# Patient Record
Sex: Female | Born: 1980
Health system: Southern US, Community
[De-identification: ages and names within clinical notes are randomized; demographics above are authoritative.]

## PROBLEM LIST (undated history)

## (undated) DIAGNOSIS — N200 Calculus of kidney: Secondary | ICD-10-CM

## (undated) HISTORY — PX: KIDNEY STONE SURGERY: SHX686

---

## 2013-02-03 ENCOUNTER — Encounter (HOSPITAL_BASED_OUTPATIENT_CLINIC_OR_DEPARTMENT_OTHER): Payer: Self-pay | Admitting: *Deleted

## 2013-02-03 ENCOUNTER — Emergency Department (HOSPITAL_BASED_OUTPATIENT_CLINIC_OR_DEPARTMENT_OTHER)
Admission: EM | Admit: 2013-02-03 | Discharge: 2013-02-04 | Disposition: A | Discharge status: Home / Self Care | Payer: Medicaid Other | Attending: Emergency Medicine | Admitting: Emergency Medicine

## 2013-02-03 DIAGNOSIS — Z331 Pregnant state, incidental: Secondary | ICD-10-CM | POA: Insufficient documentation

## 2013-02-03 DIAGNOSIS — R110 Nausea: Secondary | ICD-10-CM | POA: Insufficient documentation

## 2013-02-03 DIAGNOSIS — N760 Acute vaginitis: Secondary | ICD-10-CM | POA: Insufficient documentation

## 2013-02-03 DIAGNOSIS — Z3490 Encounter for supervision of normal pregnancy, unspecified, unspecified trimester: Secondary | ICD-10-CM

## 2013-02-03 DIAGNOSIS — R52 Pain, unspecified: Secondary | ICD-10-CM

## 2013-02-03 DIAGNOSIS — B9689 Other specified bacterial agents as the cause of diseases classified elsewhere: Secondary | ICD-10-CM

## 2013-02-03 DIAGNOSIS — O4690 Antepartum hemorrhage, unspecified, unspecified trimester: Secondary | ICD-10-CM

## 2013-02-03 DIAGNOSIS — F17200 Nicotine dependence, unspecified, uncomplicated: Secondary | ICD-10-CM | POA: Insufficient documentation

## 2013-02-03 DIAGNOSIS — N898 Other specified noninflammatory disorders of vagina: Secondary | ICD-10-CM | POA: Insufficient documentation

## 2013-02-03 LAB — WET PREP, GENITAL

## 2013-02-03 LAB — CBC WITH DIFFERENTIAL/PLATELET
Eosinophils Absolute: 0.1 10*3/uL (ref 0.0–0.7)
Eosinophils Relative: 1 % (ref 0–5)
Hemoglobin: 12.1 g/dL (ref 12.0–15.0)
Lymphs Abs: 2.8 10*3/uL (ref 0.7–4.0)
MCH: 34.1 pg — ABNORMAL HIGH (ref 26.0–34.0)
MCV: 96.9 fL (ref 78.0–100.0)
Monocytes Absolute: 1 10*3/uL (ref 0.1–1.0)
Monocytes Relative: 11 % (ref 3–12)
Platelets: 271 10*3/uL (ref 150–400)
RBC: 3.55 MIL/uL — ABNORMAL LOW (ref 3.87–5.11)

## 2013-02-03 LAB — URINALYSIS, ROUTINE W REFLEX MICROSCOPIC
Bilirubin Urine: NEGATIVE
Ketones, ur: NEGATIVE mg/dL
Nitrite: NEGATIVE
pH: 7 (ref 5.0–8.0)

## 2013-02-03 LAB — BASIC METABOLIC PANEL
BUN: 10 mg/dL (ref 6–23)
CO2: 25 mEq/L (ref 19–32)
Calcium: 9.3 mg/dL (ref 8.4–10.5)
Creatinine, Ser: 0.7 mg/dL (ref 0.50–1.10)
Glucose, Bld: 103 mg/dL — ABNORMAL HIGH (ref 70–99)

## 2013-02-03 LAB — URINE MICROSCOPIC-ADD ON

## 2013-02-03 MED ORDER — METRONIDAZOLE 500 MG PO TABS: 500.0000 mg | ORAL_TABLET | Freq: Two times a day (BID) | ORAL | Status: DC

## 2013-02-03 NOTE — ED Notes (Signed)
Abdominal pain x 3 days. Vaginal discharge that is beige and thick.

## 2013-02-03 NOTE — ED Provider Notes (Signed)
History     CSN: [id number]  Arrival date & time 02/03/13  2058   First MD Initiated Contact with Patient 02/03/13 2256      Chief Complaint  Patient presents with  . Abdominal Pain    (Consider location/radiation/quality/duration/timing/severity/associated sxs/prior treatment) HPI Comments: Patient presents with a three day history of lower abdominal cramping, nausea, "not feeling well".  She also complains of a slight vaginal discharge.  She is sexually active with one partner not using protection.  Last menstrual period was early last month.  She is a couple weeks late.  Has been pregnant once and has one child.  Patient is a 32 y.o. female presenting with abdominal pain. The history is provided by the patient.  Abdominal Pain This is a new problem. Episode onset: 3 days ago. The problem occurs constantly. The problem has been gradually worsening. Associated symptoms include abdominal pain. Nothing aggravates the symptoms. Nothing relieves the symptoms. She has tried nothing for the symptoms.    History reviewed. No pertinent past medical history.  History reviewed. No pertinent past surgical history.  No family history on file.  History  Substance Use Topics  . Smoking status: Current Every Day Smoker -- 0.50 packs/day    Types: Cigarettes  . Smokeless tobacco: Not on file  . Alcohol Use: Yes    OB History   Grav Para Term Preterm Abortions TAB SAB Ect Mult Living                  Review of Systems  Gastrointestinal: Positive for abdominal pain.  All other systems reviewed and are negative.    Allergies  Morphine and related  Home Medications  No current outpatient prescriptions on file.  BP 124/72  Pulse 76  Temp(Src) 98.8 F (37.1 C) (Oral)  Resp 20  Wt 115 lb (52.164 kg)  SpO2 97%  LMP 12/19/2012  Physical Exam  Nursing note and vitals reviewed. Constitutional: She is oriented to person, place, and time. She appears well-developed and  well-nourished. No distress.  HENT:  Head: Normocephalic and atraumatic.  Neck: Normal range of motion. Neck supple.  Cardiovascular: Normal rate and regular rhythm.  Exam reveals no gallop and no friction rub.   No murmur heard. Pulmonary/Chest: Effort normal and breath sounds normal. No respiratory distress. She has no wheezes.  Abdominal: Soft. Bowel sounds are normal. She exhibits no distension. There is tenderness.  There is mild suprapubic ttp with no rebound or guarding.    Genitourinary: Uterus normal. Vaginal discharge found.  There is a slight vaginal discharge present.  There are no adnexal masses and no cmt.  Musculoskeletal: Normal range of motion.  Neurological: She is alert and oriented to person, place, and time.  Skin: Skin is warm and dry. She is not diaphoretic.    ED Course  Procedures (including critical care time)  Labs Reviewed  URINALYSIS, ROUTINE W REFLEX MICROSCOPIC - Abnormal; Notable for the following:    APPearance CLOUDY (*)    Hgb urine dipstick TRACE (*)    Leukocytes, UA SMALL (*)    All other components within normal limits  PREGNANCY, URINE - Abnormal; Notable for the following:    Preg Test, Ur POSITIVE (*)    All other components within normal limits  URINE MICROSCOPIC-ADD ON - Abnormal; Notable for the following:    Squamous Epithelial / LPF MANY (*)    Bacteria, UA FEW (*)    All other components within normal limits  URINE CULTURE  WET PREP, GENITAL  GC/CHLAMYDIA PROBE AMP  CBC WITH DIFFERENTIAL  HCG, QUANTITATIVE, PREGNANCY  BASIC METABOLIC PANEL   No results found.   No diagnosis found.    MDM  The patient presents with lower abd discomfort, nausea, vaginal discharge, and "not feeling well."  The pregnancy test is positive with a quant beta of 30k.  There is no bleeding and she is hemodynamically stable.  The wet prep suggests bv, and I will treat with flagyl.  I doubt a ruptured ectopic, but will bring her back for an  ultrasound in the morning.          Geoffery Lyons, MD 02/03/13 2351

## 2013-02-04 ENCOUNTER — Ambulatory Visit (HOSPITAL_BASED_OUTPATIENT_CLINIC_OR_DEPARTMENT_OTHER)
Admission: RE | Admit: 2013-02-04 | Discharge: 2013-02-04 | Disposition: A | Discharge status: Home / Self Care | Payer: Medicaid Other | Source: Ambulatory Visit | Attending: Emergency Medicine | Admitting: Emergency Medicine

## 2013-02-04 ENCOUNTER — Encounter (HOSPITAL_BASED_OUTPATIENT_CLINIC_OR_DEPARTMENT_OTHER): Payer: Self-pay

## 2013-02-04 ENCOUNTER — Other Ambulatory Visit (HOSPITAL_BASED_OUTPATIENT_CLINIC_OR_DEPARTMENT_OTHER): Payer: Self-pay | Admitting: Emergency Medicine

## 2013-02-04 DIAGNOSIS — R52 Pain, unspecified: Secondary | ICD-10-CM

## 2013-02-04 DIAGNOSIS — O9989 Other specified diseases and conditions complicating pregnancy, childbirth and the puerperium: Secondary | ICD-10-CM | POA: Insufficient documentation

## 2013-02-04 DIAGNOSIS — O43899 Other placental disorders, unspecified trimester: Secondary | ICD-10-CM | POA: Insufficient documentation

## 2013-02-04 DIAGNOSIS — O210 Mild hyperemesis gravidarum: Secondary | ICD-10-CM | POA: Insufficient documentation

## 2013-02-04 DIAGNOSIS — R109 Unspecified abdominal pain: Secondary | ICD-10-CM | POA: Insufficient documentation

## 2013-02-04 DIAGNOSIS — O368990 Maternal care for other specified fetal problems, unspecified trimester, not applicable or unspecified: Secondary | ICD-10-CM | POA: Insufficient documentation

## 2013-02-04 DIAGNOSIS — O208 Other hemorrhage in early pregnancy: Secondary | ICD-10-CM | POA: Insufficient documentation

## 2013-02-05 LAB — URINE CULTURE: Colony Count: 40000

## 2013-02-06 ENCOUNTER — Telehealth (HOSPITAL_COMMUNITY): Payer: Self-pay | Admitting: Emergency Medicine

## 2013-02-06 NOTE — ED Notes (Signed)
Patient has +Gonorrhea. °

## 2013-02-06 NOTE — ED Notes (Signed)
+  Gonorrhea. Chart sent to EDP office for review. [facility]HS attached. °

## 2013-02-07 ENCOUNTER — Telehealth (HOSPITAL_COMMUNITY): Payer: Self-pay | Admitting: Emergency Medicine

## 2013-02-07 NOTE — Telephone Encounter (Signed)
Chart returned from EDP office. Trixie Dredge reviewed and ordered Cefixdine 400mg  po x 1 and Azithromycin 1 gram po x1. This was called to [facility] at [phone] per pts request. Message left on voice mail.

## 2014-07-17 ENCOUNTER — Encounter (HOSPITAL_BASED_OUTPATIENT_CLINIC_OR_DEPARTMENT_OTHER): Payer: Self-pay

## 2015-02-01 ENCOUNTER — Emergency Department (HOSPITAL_BASED_OUTPATIENT_CLINIC_OR_DEPARTMENT_OTHER)
Admission: EM | Admit: 2015-02-01 | Discharge: 2015-02-01 | Disposition: A | Discharge status: Home / Self Care | Payer: Medicaid Other | Attending: Emergency Medicine | Admitting: Emergency Medicine

## 2015-02-01 ENCOUNTER — Encounter (HOSPITAL_BASED_OUTPATIENT_CLINIC_OR_DEPARTMENT_OTHER): Payer: Self-pay | Admitting: *Deleted

## 2015-02-01 DIAGNOSIS — R197 Diarrhea, unspecified: Secondary | ICD-10-CM | POA: Insufficient documentation

## 2015-02-01 DIAGNOSIS — R1110 Vomiting, unspecified: Secondary | ICD-10-CM | POA: Insufficient documentation

## 2015-02-01 DIAGNOSIS — Z720 Tobacco use: Secondary | ICD-10-CM | POA: Insufficient documentation

## 2015-02-01 DIAGNOSIS — Z792 Long term (current) use of antibiotics: Secondary | ICD-10-CM | POA: Insufficient documentation

## 2015-02-01 DIAGNOSIS — J029 Acute pharyngitis, unspecified: Secondary | ICD-10-CM | POA: Insufficient documentation

## 2015-02-01 LAB — RAPID STREP SCREEN (MED CTR MEBANE ONLY): Streptococcus, Group A Screen (Direct): NEGATIVE

## 2015-02-01 MED ORDER — ONDANSETRON 4 MG PO TBDP: 4.0000 mg | ORAL_TABLET | Freq: Three times a day (TID) | ORAL | Status: DC | PRN

## 2015-02-01 NOTE — Discharge Instructions (Signed)
Nausea and Vomiting Nausea means you feel sick to your stomach. Throwing up (vomiting) is a reflex where stomach contents come out of your mouth. HOME CARE   Take medicine as told by your doctor.  Do not force yourself to eat. However, you do need to drink fluids.  If you feel like eating, eat a normal diet as told by your doctor.  Eat rice, wheat, potatoes, bread, lean meats, yogurt, fruits, and vegetables.  Avoid high-fat foods.  Drink enough fluids to keep your pee (urine) clear or pale yellow.  Ask your doctor how to replace body fluid losses (rehydrate). Signs of body fluid loss (dehydration) include:  Feeling very thirsty.  Dry lips and mouth.  Feeling dizzy.  Dark pee.  Peeing less than normal.  Feeling confused.  Fast breathing or heart rate. GET HELP RIGHT AWAY IF:   You have blood in your throw up.  You have black or bloody poop (stool).  You have a bad headache or stiff neck.  You feel confused.  You have bad belly (abdominal) pain.  You have chest pain or trouble breathing.  You do not pee at least once every 8 hours.  You have cold, clammy skin.  You keep throwing up after 24 to 48 hours.  You have a fever. MAKE SURE YOU:   Understand these instructions.  Will watch your condition.  Will get help right away if you are not doing well or get worse. Document Released: 02/18/2008 Document Revised: 11/24/2011 Document Reviewed: 01/31/2011 [facility] Patient Information 2015 [place], [place]. This information is not intended to replace advice given to you by your health care provider. Make sure you discuss any questions you have with your health care provider.  Pharyngitis Pharyngitis is a sore throat (pharynx). There is redness, pain, and swelling of your throat. HOME CARE   Drink enough fluids to keep your pee (urine) clear or pale yellow.  Only take medicine as told by your doctor.  You may get sick again if you do not take medicine as  told. Finish your medicines, even if you start to feel better.  Do not take aspirin.  Rest.  Rinse your mouth (gargle) with salt water ( tsp of salt per 1 qt of water) every 1-2 hours. This will help the pain.  If you are not at risk for choking, you can suck on hard candy or sore throat lozenges. GET HELP IF:  You have large, tender lumps on your neck.  You have a rash.  You cough up green, yellow-brown, or bloody spit. GET HELP RIGHT AWAY IF:   You have a stiff neck.  You drool or cannot swallow liquids.  You throw up (vomit) or are not able to keep medicine or liquids down.  You have very bad pain that does not go away with medicine.  You have problems breathing (not from a stuffy nose). MAKE SURE YOU:   Understand these instructions.  Will watch your condition.  Will get help right away if you are not doing well or get worse. Document Released: 02/18/2008 Document Revised: 06/22/2013 Document Reviewed: 05/09/2013 [facility]Care Patient Information 2015 [place], [place]. This information is not intended to replace advice given to you by your health care provider. Make sure you discuss any questions you have with your health care provider.

## 2015-02-01 NOTE — ED Provider Notes (Signed)
CSN: [id number]     Arrival date & time 02/01/15  1430 History   First MD Initiated Contact with Patient 02/01/15 1446     Chief Complaint  Patient presents with  . Sore Throat     (Consider location/radiation/quality/duration/timing/severity/associated sxs/prior Treatment) HPI Comments: Pt comes in with c/o sore throat, chill times a couple of days. Had vomiting and diarrhea for about 8 hours which has resolved. Pt daughter was diagnosed with strep earlier this week. No cough. No meningeal symptoms.no definite fever  The history is provided by the patient. No language interpreter was used.    History reviewed. No pertinent past medical history. History reviewed. No pertinent past surgical history. No family history on file. History  Substance Use Topics  . Smoking status: Current Every Day Smoker -- 0.50 packs/day    Types: Cigarettes  . Smokeless tobacco: Not on file  . Alcohol Use: Yes   OB History    Gravida Para Term Preterm AB TAB SAB Ectopic Multiple Living   1              Review of Systems  All other systems reviewed and are negative.     Allergies  Morphine and related  Home Medications   Prior to Admission medications   Medication Sig Start Date End Date Taking? Authorizing Provider  metroNIDAZOLE (FLAGYL) 500 MG tablet Take 1 tablet (500 mg total) by mouth 2 (two) times daily. One po bid x 7 days 02/03/13   Geoffery Lyonsouglas Delo, MD   BP 119/78 mmHg  Pulse 110  Temp(Src) 98.9 F (37.2 C) (Oral)  Resp 20  Ht 5\' 4"  (1.626 m)  Wt 112 lb 6 oz (50.973 kg)  BMI 19.28 kg/m2  SpO2 99%  LMP 01/02/2015  Breastfeeding? Unknown Physical Exam  Constitutional: She is oriented to person, place, and time. She appears well-developed and well-nourished.  HENT:  Right Ear: External ear normal.  Left Ear: External ear normal.  Nose: Rhinorrhea present.  Mouth/Throat: Posterior oropharyngeal edema and posterior oropharyngeal erythema present. No oropharyngeal exudate.   Eyes: Conjunctivae and EOM are normal. Pupils are equal, round, and reactive to light.  Neck: Normal range of motion. Neck supple.  Cardiovascular: Normal rate and regular rhythm.   Pulmonary/Chest: Effort normal and breath sounds normal.  Abdominal: Soft. Bowel sounds are normal.  Musculoskeletal: Normal range of motion.  Neurological: She is alert and oriented to person, place, and time.  Skin: Skin is warm and dry.  Psychiatric: She has a normal mood and affect.  Nursing note and vitals reviewed.   ED Course  Procedures (including critical care time) Labs Review Labs Reviewed  RAPID STREP SCREEN  CULTURE, GROUP A STREP    Imaging Review No results found.   EKG Interpretation None      MDM   Final diagnoses:  Pharyngitis  Vomiting and diarrhea    Likely viral. Pt is tolerating po without any problem.    Teressa LowerVrinda Jerrick Farve, NP 02/01/15 1548  Elwin MochaBlair Walden, MD 02/02/15 [phone]

## 2015-02-01 NOTE — ED Notes (Signed)
Sore throat, vomiting, diarrhea, chills and cough.  

## 2015-02-03 LAB — CULTURE, GROUP A STREP: STREP A CULTURE: NEGATIVE

## 2015-12-25 ENCOUNTER — Emergency Department (HOSPITAL_BASED_OUTPATIENT_CLINIC_OR_DEPARTMENT_OTHER): Payer: Medicaid Other

## 2015-12-25 ENCOUNTER — Emergency Department (HOSPITAL_BASED_OUTPATIENT_CLINIC_OR_DEPARTMENT_OTHER)
Admission: EM | Admit: 2015-12-25 | Discharge: 2015-12-25 | Disposition: A | Discharge status: Home / Self Care | Payer: Medicaid Other | Attending: Emergency Medicine | Admitting: Emergency Medicine

## 2015-12-25 ENCOUNTER — Encounter (HOSPITAL_BASED_OUTPATIENT_CLINIC_OR_DEPARTMENT_OTHER): Payer: Self-pay | Admitting: *Deleted

## 2015-12-25 DIAGNOSIS — F17210 Nicotine dependence, cigarettes, uncomplicated: Secondary | ICD-10-CM | POA: Insufficient documentation

## 2015-12-25 DIAGNOSIS — K5900 Constipation, unspecified: Secondary | ICD-10-CM

## 2015-12-25 DIAGNOSIS — B9689 Other specified bacterial agents as the cause of diseases classified elsewhere: Secondary | ICD-10-CM

## 2015-12-25 DIAGNOSIS — A599 Trichomoniasis, unspecified: Secondary | ICD-10-CM | POA: Insufficient documentation

## 2015-12-25 DIAGNOSIS — N760 Acute vaginitis: Secondary | ICD-10-CM | POA: Insufficient documentation

## 2015-12-25 DIAGNOSIS — R1030 Lower abdominal pain, unspecified: Secondary | ICD-10-CM

## 2015-12-25 LAB — CBC WITH DIFFERENTIAL/PLATELET
BASOS PCT: 0 %
Basophils Absolute: 0 10*3/uL (ref 0.0–0.1)
EOS ABS: 0.1 10*3/uL (ref 0.0–0.7)
EOS PCT: 1 %
HEMATOCRIT: 35.6 % — AB (ref 36.0–46.0)
HEMOGLOBIN: 11.8 g/dL — AB (ref 12.0–15.0)
Lymphocytes Relative: 25 %
Lymphs Abs: 1.9 10*3/uL (ref 0.7–4.0)
MCH: 32.4 pg (ref 26.0–34.0)
MCHC: 33.1 g/dL (ref 30.0–36.0)
MCV: 97.8 fL (ref 78.0–100.0)
Monocytes Absolute: 0.6 10*3/uL (ref 0.1–1.0)
Monocytes Relative: 8 %
NEUTROS PCT: 66 %
Neutro Abs: 5.1 10*3/uL (ref 1.7–7.7)
Platelets: 333 10*3/uL (ref 150–400)
RBC: 3.64 MIL/uL — ABNORMAL LOW (ref 3.87–5.11)
RDW: 12.2 % (ref 11.5–15.5)
WBC: 7.6 10*3/uL (ref 4.0–10.5)

## 2015-12-25 LAB — URINALYSIS, ROUTINE W REFLEX MICROSCOPIC
Bilirubin Urine: NEGATIVE
Glucose, UA: NEGATIVE mg/dL
Ketones, ur: NEGATIVE mg/dL
Nitrite: NEGATIVE
PROTEIN: 30 mg/dL — AB
SPECIFIC GRAVITY, URINE: 1.028 (ref 1.005–1.030)
pH: 6 (ref 5.0–8.0)

## 2015-12-25 LAB — COMPREHENSIVE METABOLIC PANEL
ALBUMIN: 4.1 g/dL (ref 3.5–5.0)
ALK PHOS: 47 U/L (ref 38–126)
ALT: 11 U/L — ABNORMAL LOW (ref 14–54)
ANION GAP: 3 — AB (ref 5–15)
AST: 16 U/L (ref 15–41)
BILIRUBIN TOTAL: 0.7 mg/dL (ref 0.3–1.2)
BUN: 7 mg/dL (ref 6–20)
CO2: 26 mmol/L (ref 22–32)
Calcium: 8.8 mg/dL — ABNORMAL LOW (ref 8.9–10.3)
Chloride: 107 mmol/L (ref 101–111)
Creatinine, Ser: 0.77 mg/dL (ref 0.44–1.00)
Glucose, Bld: 101 mg/dL — ABNORMAL HIGH (ref 65–99)
POTASSIUM: 3.8 mmol/L (ref 3.5–5.1)
Sodium: 136 mmol/L (ref 135–145)
TOTAL PROTEIN: 7 g/dL (ref 6.5–8.1)

## 2015-12-25 LAB — WET PREP, GENITAL
Sperm: NONE SEEN
YEAST WET PREP: NONE SEEN

## 2015-12-25 LAB — URINE MICROSCOPIC-ADD ON

## 2015-12-25 LAB — PREGNANCY, URINE: PREG TEST UR: NEGATIVE

## 2015-12-25 MED ORDER — AZITHROMYCIN 250 MG PO TABS
1000.0000 mg | ORAL_TABLET | Freq: Every day | ORAL | Status: DC
  Administered 2015-12-25: 1000 mg via ORAL
  Filled 2015-12-25: qty 4

## 2015-12-25 MED ORDER — CEFTRIAXONE SODIUM 250 MG IJ SOLR
250.0000 mg | Freq: Once | INTRAMUSCULAR | Status: AC
  Administered 2015-12-25: 250 mg via INTRAMUSCULAR
  Filled 2015-12-25: qty 250

## 2015-12-25 MED ORDER — KETOROLAC TROMETHAMINE 30 MG/ML IJ SOLN: 30.0000 mg | Freq: Once | INTRAMUSCULAR | Status: DC

## 2015-12-25 MED ORDER — LIDOCAINE HCL (PF) 1 % IJ SOLN
INTRAMUSCULAR | Status: AC
  Administered 2015-12-25: 5 mL
  Filled 2015-12-25: qty 5

## 2015-12-25 MED ORDER — ONDANSETRON 4 MG PO TBDP
4.0000 mg | ORAL_TABLET | Freq: Once | ORAL | Status: AC
  Administered 2015-12-25: 4 mg via ORAL
  Filled 2015-12-25: qty 1

## 2015-12-25 MED ORDER — KETOROLAC TROMETHAMINE 30 MG/ML IJ SOLN
30.0000 mg | Freq: Once | INTRAMUSCULAR | Status: AC
  Administered 2015-12-25: 30 mg via INTRAMUSCULAR
  Filled 2015-12-25: qty 1

## 2015-12-25 MED ORDER — METRONIDAZOLE 500 MG PO TABS: 500.0000 mg | ORAL_TABLET | Freq: Two times a day (BID) | ORAL | Status: DC

## 2015-12-25 MED FILL — metroNIDAZOLE 500 MG TABS: 500 | 7 days supply | Qty: 14 | Fill #0

## 2015-12-25 NOTE — ED Provider Notes (Signed)
CSN: [id number]     Arrival date & time 12/25/15  1211 History   First MD Initiated Contact with Patient 12/25/15 1232     Chief Complaint  Patient presents with  . Abdominal Pain     (Consider location/radiation/quality/duration/timing/severity/associated sxs/prior Treatment) HPI Comments: 35 y.o. Female with no significant past medical history presents for lower abdominal pain x1 week. The patient reports that she is at the end of her menstrual cycle and that it has been a normal menses for her.  She states over the last week she has had discomfort and cramping pain in her lower abdomen all the way across.  She also reports feeling the urge to have a bowel movement frequently but not always passing stool.  Denies nausea, vomiting, diarrhea, fever, chills.  Reports normal appetite and eating patterns.  Normal urination.    History reviewed. No pertinent past medical history. History reviewed. No pertinent past surgical history. No family history on file. Social History  Substance Use Topics  . Smoking status: Current Every Day Smoker -- 0.50 packs/day    Types: Cigarettes  . Smokeless tobacco: None  . Alcohol Use: Yes   OB History    Gravida Para Term Preterm AB TAB SAB Ectopic Multiple Living   1              Review of Systems  Constitutional: Negative for fever, activity change, appetite change and fatigue.  HENT: Negative for congestion, ear pain, postnasal drip, rhinorrhea and sinus pressure.   Eyes: Negative for pain and visual disturbance.  Respiratory: Negative for cough, chest tightness and shortness of breath.   Cardiovascular: Negative for chest pain and palpitations.  Gastrointestinal: Positive for abdominal pain (lower) and constipation. Negative for nausea, vomiting and diarrhea.  Genitourinary: Positive for vaginal bleeding (on the end of her menses). Negative for dysuria, urgency, frequency, hematuria, flank pain, vaginal discharge and pelvic pain.   Musculoskeletal: Negative for myalgias and back pain.  Skin: Negative for rash.  Neurological: Negative for dizziness, weakness and headaches.  Hematological: Does not bruise/bleed easily.      Allergies  Morphine and related  Home Medications   Prior to Admission medications   Medication Sig Start Date End Date Taking? Authorizing Provider  metroNIDAZOLE (FLAGYL) 500 MG tablet Take 1 tablet (500 mg total) by mouth 2 (two) times daily. One po bid x 7 days 12/25/15   Leta Baptist, MD  ondansetron (ZOFRAN ODT) 4 MG disintegrating tablet Take 1 tablet (4 mg total) by mouth every 8 (eight) hours as needed for nausea or vomiting. 02/01/15   Teressa Lower, NP   BP 121/78 mmHg  Pulse 66  Temp(Src) 98.2 F (36.8 C) (Oral)  Resp 18  Ht  (1.626 m)  Wt 112 lb (50.803 kg)  BMI 19.22 kg/m2  SpO2 100%  LMP 12/20/2015 Physical Exam  Constitutional: She is oriented to person, place, and time. She appears well-developed and well-nourished. No distress.  HENT:  Head: Normocephalic and atraumatic.  Right Ear: External ear normal.  Left Ear: External ear normal.  Nose: Nose normal.  Mouth/Throat: Oropharynx is clear and moist. No oropharyngeal exudate.  Eyes: EOM are normal. Pupils are equal, round, and reactive to light.  Neck: Normal range of motion. Neck supple.  Cardiovascular: Normal rate, regular rhythm, normal heart sounds and intact distal pulses.   No murmur heard. Pulmonary/Chest: Effort normal. No respiratory distress. She has no wheezes. She has no rales.  Abdominal: Soft. She exhibits no distension.  There is no tenderness. Hernia confirmed negative in the right inguinal area and confirmed negative in the left inguinal area.  Genitourinary: Uterus normal. There is no rash, tenderness or lesion on the right labia. There is no rash, tenderness or lesion on the left labia. Cervix exhibits motion tenderness, discharge and friability. Right adnexum displays no mass, no  tenderness and no fullness. Left adnexum displays tenderness (mild). Left adnexum displays no mass and no fullness. No tenderness or bleeding in the vagina. Vaginal discharge found.  Musculoskeletal: Normal range of motion. She exhibits no edema or tenderness.  Neurological: She is alert and oriented to person, place, and time.  Skin: Skin is warm and dry. No rash noted. She is not diaphoretic.  Vitals reviewed.   ED Course  Procedures (including critical care time) Labs Review Labs Reviewed  WET PREP, GENITAL - Abnormal; Notable for the following:    Trich, Wet Prep PRESENT (*)    Clue Cells Wet Prep HPF POC PRESENT (*)    WBC, Wet Prep HPF POC MANY (*)    All other components within normal limits  URINALYSIS, ROUTINE W REFLEX MICROSCOPIC (NOT [facility]) - Abnormal; Notable for the following:    Color, Urine RED (*)    APPearance CLOUDY (*)    Hgb urine dipstick LARGE (*)    Protein, ur 30 (*)    Leukocytes, UA SMALL (*)    All other components within normal limits  CBC WITH DIFFERENTIAL/PLATELET - Abnormal; Notable for the following:    RBC 3.64 (*)    Hemoglobin 11.8 (*)    HCT 35.6 (*)    All other components within normal limits  COMPREHENSIVE METABOLIC PANEL - Abnormal; Notable for the following:    Glucose, Bld 101 (*)    Calcium 8.8 (*)    ALT 11 (*)    Anion gap 3 (*)    All other components within normal limits  URINE MICROSCOPIC-ADD ON - Abnormal; Notable for the following:    Squamous Epithelial / LPF 0-5 (*)    Bacteria, UA RARE (*)    All other components within normal limits  PREGNANCY, URINE  GC/CHLAMYDIA PROBE AMP ([place]) NOT AT [facility]    Imaging Review [place] Transvaginal Non-ob  12/25/2015  CLINICAL DATA:  Mid pelvic pain for 1 week.  Constipation. EXAM: TRANSABDOMINAL ULTRASOUND OF PELVIS TECHNIQUE: Transabdominal ultrasound examination of the pelvis was performed including evaluation of the uterus, ovaries, adnexal regions, and pelvic cul-de-sac.  COMPARISON:  None. FINDINGS: Uterus Measurements: 8.2 x 5 x 6.1 cm. No fibroids or other mass visualized. Endometrium Thickness: 7 mm.  No focal abnormality visualized. Right ovary Measurements: 3.6 x 2.3 x 2.4 cm . 8 mm hypoechoic avascular right ovarian mass. Left ovary Measurements: 3.4 x 2 x 1.9 cm. Normal appearance/no adnexal mass. Other findings:  Trace pelvic free fluid, likely physiologic. IMPRESSION: 1. 8 mm hypoechoic avascular right ovarian mass likely representing an endometrioma versus hemorrhagic cyst. Electronically Signed   By: Elige KoHetal  Patel   On: 12/25/2015 13:51   [place] Pelvis Complete  12/25/2015  CLINICAL DATA:  Mid pelvic pain for 1 week.  Constipation. EXAM: TRANSABDOMINAL ULTRASOUND OF PELVIS TECHNIQUE: Transabdominal ultrasound examination of the pelvis was performed including evaluation of the uterus, ovaries, adnexal regions, and pelvic cul-de-sac. COMPARISON:  None. FINDINGS: Uterus Measurements: 8.2 x 5 x 6.1 cm. No fibroids or other mass visualized. Endometrium Thickness: 7 mm.  No focal abnormality visualized. Right ovary Measurements: 3.6 x 2.3 x 2.4  cm . 8 mm hypoechoic avascular right ovarian mass. Left ovary Measurements: 3.4 x 2 x 1.9 cm. Normal appearance/no adnexal mass. Other findings:  Trace pelvic free fluid, likely physiologic. IMPRESSION: 1. 8 mm hypoechoic avascular right ovarian mass likely representing an endometrioma versus hemorrhagic cyst. Electronically Signed   By: Elige Ko   On: 12/25/2015 13:51   Dg Abd Acute W/chest  12/25/2015  CLINICAL DATA:  Pelvic pain constipation for 5 days EXAM: DG ABDOMEN ACUTE W/ 1V CHEST COMPARISON:  None. FINDINGS: There is no evidence of dilated bowel loops or free intraperitoneal air. No radiopaque calculi or other significant radiographic abnormality is seen. Heart size and mediastinal contours are within normal limits. Both lungs are clear. IMPRESSION: Negative abdominal radiographs.  No acute cardiopulmonary disease.  Electronically Signed   By: Elige Ko   On: 12/25/2015 14:03   I have personally reviewed and evaluated these images and lab results as part of my medical decision-making.   EKG Interpretation None      MDM  Patient seen and evaluated in stable condition.  Relatively benign examination.  Pelvic US with cystic structure vs endometriosis on right ovary.  Labs unremarkable other than we prep positive for clue cells and trich.  Patient treated with IM Rocephin, Azithromycin.  She was discharged with prescriptions for flagyl.  She was discharged home in stable condition with strict return precautions. Final diagnoses:  Constipation  Lower abdominal pain  Trichimoniasis  Bacterial vaginitis    1. Trichimoniasis     Leta Baptist, MD 12/26/15 2134

## 2015-12-25 NOTE — ED Notes (Signed)
Lower abdominal pain x 1 week. Pressure to have a BM.

## 2015-12-25 NOTE — Discharge Instructions (Signed)
You were seen and evaluated today for your abdominal discomfort. Your found to have infection with trichomoniasis you were treated for other STDs with 2 antibiotics. Take the antibiotic prescribed for 7 days to treat the trick. It was also noted on your ultrasound that you have something on your right ovary that appears to either be a cyst or Scott endometriosis. This needs to be followed up outpatient with an OB/GYN.  Trichomoniasis Trichomoniasis is an infection caused by an organism called Trichomonas. The infection can affect both women and men. In women, the outer female genitalia and the vagina are affected. In men, the penis is mainly affected, but the prostate and other reproductive organs can also be involved. Trichomoniasis is a sexually transmitted infection (STI) and is most often passed to another person through sexual contact.  RISK FACTORS  Having unprotected sexual intercourse.  Having sexual intercourse with an infected partner. SIGNS AND SYMPTOMS  Symptoms of trichomoniasis in women include:  Abnormal gray-green frothy vaginal discharge.  Itching and irritation of the vagina.  Itching and irritation of the area outside the vagina. Symptoms of trichomoniasis in men include:   Penile discharge with or without pain.  Pain during urination. This results from inflammation of the urethra. DIAGNOSIS  Trichomoniasis may be found during a Pap test or physical exam. Your health care provider may use one of the following methods to help diagnose this infection:  Testing the pH of the vagina with a test tape.  Using a vaginal swab test that checks for the Trichomonas organism. A test is available that provides results within a few minutes.  Examining a urine sample.  Testing vaginal secretions. Your health care provider may test you for other STIs, including HIV. TREATMENT   You may be given medicine to fight the infection. Women should inform their health care provider if  they could be or are pregnant. Some medicines used to treat the infection should not be taken during pregnancy.  Your health care provider may recommend over-the-counter medicines or creams to decrease itching or irritation.  Your sexual partner will need to be treated if infected.  Your health care provider may test you for infection again 3 months after treatment. HOME CARE INSTRUCTIONS   Take medicines only as directed by your health care provider.  Take over-the-counter medicine for itching or irritation as directed by your health care provider.  Do not have sexual intercourse while you have the infection.  Women should not douche or wear tampons while they have the infection.  Discuss your infection with your partner. Your partner may have gotten the infection from you, or you may have gotten it from your partner.  Have your sex partner get examined and treated if necessary.  Practice safe, informed, and protected sex.  See your health care provider for other STI testing. SEEK MEDICAL CARE IF:   You still have symptoms after you finish your medicine.  You develop abdominal pain.  You have pain when you urinate.  You have bleeding after sexual intercourse.  You develop a rash.  Your medicine makes you sick or makes you throw up (vomit). MAKE SURE YOU:  Understand these instructions.  Will watch your condition.  Will get help right away if you are not doing well or get worse.   This information is not intended to replace advice given to you by your health care provider. Make sure you discuss any questions you have with your health care provider.   Document Released: 02/25/2001  Document Revised: 09/22/2014 Document Reviewed: 06/13/2013 Elsevier Interactive Patient Education 2016 [employer]  Ovarian Cyst An ovarian cyst is a fluid-filled sac that forms on an ovary. The ovaries are small organs that produce eggs in women. Various types of cysts can form on the  ovaries. Most are not cancerous. Many do not cause problems, and they often go away on their own. Some may cause symptoms and require treatment. Common types of ovarian cysts include:  Functional cysts--These cysts may occur every month during the menstrual cycle. This is normal. The cysts usually go away with the next menstrual cycle if the woman does not get pregnant. Usually, there are no symptoms with a functional cyst.  Endometrioma cysts--These cysts form from the tissue that lines the uterus. They are also called "chocolate cysts" because they become filled with blood that turns brown. This type of cyst can cause pain in the lower abdomen during intercourse and with your menstrual period.  Cystadenoma cysts--This type develops from the cells on the outside of the ovary. These cysts can get very big and cause lower abdomen pain and pain with intercourse. This type of cyst can twist on itself, cut off its blood supply, and cause severe pain. It can also easily rupture and cause a lot of pain.  Dermoid cysts--This type of cyst is sometimes found in both ovaries. These cysts may contain different kinds of body tissue, such as skin, teeth, hair, or cartilage. They usually do not cause symptoms unless they get very big.  Theca lutein cysts--These cysts occur when too much of a certain hormone (human chorionic gonadotropin) is produced and overstimulates the ovaries to produce an egg. This is most common after procedures used to assist with the conception of a baby (in vitro fertilization). CAUSES   Fertility drugs can cause a condition in which multiple large cysts are formed on the ovaries. This is called ovarian hyperstimulation syndrome.  A condition called polycystic ovary syndrome can cause hormonal imbalances that can lead to nonfunctional ovarian cysts. SIGNS AND SYMPTOMS  Many ovarian cysts do not cause symptoms. If symptoms are present, they may include:  Pelvic pain or pressure.  Pain  in the lower abdomen.  Pain during sexual intercourse.  Increasing girth (swelling) of the abdomen.  Abnormal menstrual periods.  Increasing pain with menstrual periods.  Stopping having menstrual periods without being pregnant. DIAGNOSIS  These cysts are commonly found during a routine or annual pelvic exam. Tests may be ordered to find out more about the cyst. These tests may include:  Ultrasound.  X-ray of the pelvis.  CT scan.  MRI.  Blood tests. TREATMENT  Many ovarian cysts go away on their own without treatment. Your health care provider may want to check your cyst regularly for 2-3 months to see if it changes. For women in menopause, it is particularly important to monitor a cyst closely because of the higher rate of ovarian cancer in menopausal women. When treatment is needed, it may include any of the following:  A procedure to drain the cyst (aspiration). This may be done using a long needle and ultrasound. It can also be done through a laparoscopic procedure. This involves using a thin, lighted tube with a tiny camera on the end (laparoscope) inserted through a small incision.  Surgery to remove the whole cyst. This may be done using laparoscopic surgery or an open surgery involving a larger incision in the lower abdomen.  Hormone treatment or birth control pills. These methods are  sometimes used to help dissolve a cyst. HOME CARE INSTRUCTIONS   Only take over-the-counter or prescription medicines as directed by your health care provider.  Follow up with your health care provider as directed.  Get regular pelvic exams and Pap tests. SEEK MEDICAL CARE IF:   Your periods are late, irregular, or painful, or they stop.  Your pelvic pain or abdominal pain does not go away.  Your abdomen becomes larger or swollen.  You have pressure on your bladder or trouble emptying your bladder completely.  You have pain during sexual intercourse.  You have feelings of  fullness, pressure, or discomfort in your stomach.  You lose weight for no apparent reason.  You feel generally ill.  You become constipated.  You lose your appetite.  You develop acne.  You have an increase in body and facial hair.  You are gaining weight, without changing your exercise and eating habits.  You think you are pregnant. SEEK IMMEDIATE MEDICAL CARE IF:   You have increasing abdominal pain.  You feel sick to your stomach (nauseous), and you throw up (vomit).  You develop a fever that comes on suddenly.  You have abdominal pain during a bowel movement.  Your menstrual periods become heavier than usual. MAKE SURE YOU:  Understand these instructions.  Will watch your condition.  Will get help right away if you are not doing well or get worse.   This information is not intended to replace advice given to you by your health care provider. Make sure you discuss any questions you have with your health care provider.   Document Released: 09/01/2005 Document Revised: 09/06/2013 Document Reviewed: 05/09/2013 Elsevier Interactive Patient Education [employer]

## 2015-12-26 LAB — GC/CHLAMYDIA PROBE AMP (~~LOC~~) NOT AT ARMC
Chlamydia: NEGATIVE
Neisseria Gonorrhea: NEGATIVE

## 2016-05-05 ENCOUNTER — Emergency Department (HOSPITAL_BASED_OUTPATIENT_CLINIC_OR_DEPARTMENT_OTHER)
Admission: EM | Admit: 2016-05-05 | Discharge: 2016-05-05 | Disposition: A | Discharge status: Home / Self Care | Payer: Medicaid Other | Attending: Emergency Medicine | Admitting: Emergency Medicine

## 2016-05-05 ENCOUNTER — Encounter (HOSPITAL_BASED_OUTPATIENT_CLINIC_OR_DEPARTMENT_OTHER): Payer: Self-pay | Admitting: *Deleted

## 2016-05-05 DIAGNOSIS — R112 Nausea with vomiting, unspecified: Secondary | ICD-10-CM | POA: Insufficient documentation

## 2016-05-05 DIAGNOSIS — R51 Headache: Secondary | ICD-10-CM | POA: Insufficient documentation

## 2016-05-05 DIAGNOSIS — F17210 Nicotine dependence, cigarettes, uncomplicated: Secondary | ICD-10-CM | POA: Insufficient documentation

## 2016-05-05 DIAGNOSIS — R197 Diarrhea, unspecified: Secondary | ICD-10-CM | POA: Insufficient documentation

## 2016-05-05 DIAGNOSIS — R531 Weakness: Secondary | ICD-10-CM | POA: Insufficient documentation

## 2016-05-05 LAB — URINE MICROSCOPIC-ADD ON

## 2016-05-05 LAB — URINALYSIS, ROUTINE W REFLEX MICROSCOPIC
Bilirubin Urine: NEGATIVE
Glucose, UA: NEGATIVE mg/dL
Ketones, ur: 15 mg/dL — AB
NITRITE: NEGATIVE
Protein, ur: NEGATIVE mg/dL
SPECIFIC GRAVITY, URINE: 1.016 (ref 1.005–1.030)
pH: 7 (ref 5.0–8.0)

## 2016-05-05 LAB — PREGNANCY, URINE: PREG TEST UR: NEGATIVE

## 2016-05-05 MED ORDER — ONDANSETRON 8 MG PO TBDP: 8.0000 mg | ORAL_TABLET | Freq: Three times a day (TID) | ORAL | 0 refills | Status: AC | PRN

## 2016-05-05 MED ORDER — ONDANSETRON 8 MG PO TBDP
8.0000 mg | ORAL_TABLET | Freq: Once | ORAL | Status: AC
  Administered 2016-05-05: 8 mg via ORAL
  Filled 2016-05-05: qty 1

## 2016-05-05 NOTE — ED Provider Notes (Signed)
[facility]   CSN: [id number] Arrival date & time: 05/05/16  1811  By signing my name below, I, Nelwyn SalisburyJoshua Fowler, attest that this documentation has been prepared under the direction and in the presence of Azalia BilisKevin Zulay Corrie, MD . Electronically Signed: Nelwyn SalisburyJoshua Fowler, Scribe. 05/05/2016. 7:32 PM.  History   Chief Complaint Chief Complaint  Patient presents with  . Abdominal Pain   The history is provided by the patient. No language interpreter was used.     HPI Comments:  Morgan Mccoy is a 35 y.o. female who presents to the Emergency Department complaining of sudden-onset emesis onset this morning. She reports associated abdominal pain, headache, weakness, nausea, diarrhea, and loss of appetite. Pt denies fever and dysuria. Her LNMP was on 04/24/2016.  History reviewed. No pertinent past medical history.  There are no active problems to display for this patient.   History reviewed. No pertinent surgical history.  OB History    Gravida Para Term Preterm AB Living   1             SAB TAB Ectopic Multiple Live Births                   Home Medications    Prior to Admission medications   Not on File    Family History No family history on file.  Social History Social History  Substance Use Topics  . Smoking status: Current Every Day Smoker    Packs/day: 0.50    Types: Cigarettes  . Smokeless tobacco: Not on file  . Alcohol use Yes     Allergies   Morphine and related   Review of Systems Review of Systems  Gastrointestinal: Positive for abdominal pain.   10 Systems reviewed and are negative for acute change except as noted in the HPI.   Physical Exam Updated Vital Signs BP 117/94 (BP Location: Right Arm)   Pulse 71   Temp 97.9 F (36.6 C) (Oral)   Resp 20   Ht 5\' 4"  (1.626 m)   Wt 110 lb (49.9 kg)   LMP 04/24/2016   SpO2 100%   BMI 18.88 kg/m   Physical Exam  Constitutional: She is oriented to person, place, and time.  She appears well-developed and well-nourished. No distress.  HENT:  Head: Normocephalic and atraumatic.  Eyes: EOM are normal.  Neck: Normal range of motion.  Cardiovascular: Normal rate, regular rhythm and normal heart sounds.   Pulmonary/Chest: Effort normal and breath sounds normal.  Abdominal: Soft. She exhibits no distension. There is no tenderness.  Musculoskeletal: Normal range of motion.  Neurological: She is alert and oriented to person, place, and time.  Skin: Skin is warm and dry.  Psychiatric: She has a normal mood and affect. Judgment normal.  Nursing note and vitals reviewed.    ED Treatments / Results  DIAGNOSTIC STUDIES:  Oxygen Saturation is 99% on RA, normal by my interpretation.    COORDINATION OF CARE:  7:32 PM Discussed treatment plan with pt at bedside which included nausea medication and pt agreed to plan.  Labs (all labs ordered are listed, but only abnormal results are displayed) Labs Reviewed  URINALYSIS, ROUTINE W REFLEX MICROSCOPIC [facility]) - Abnormal; Notable for the following:       Result Value   APPearance CLOUDY (*)    Hgb urine dipstick MODERATE (*)    Ketones, ur 15 (*)    Leukocytes, UA SMALL (*)    All other components within  normal limits  URINE MICROSCOPIC-ADD ON - Abnormal; Notable for the following:    Squamous Epithelial / LPF 6-30 (*)    Bacteria, UA FEW (*)    All other components within normal limits  PREGNANCY, URINE    EKG  EKG Interpretation None       Radiology No results found.  Procedures Procedures (including critical care time)  Medications Ordered in ED Medications  ondansetron (ZOFRAN-ODT) disintegrating tablet 8 mg (8 mg Oral Given 05/05/16 1947)     Initial Impression / Assessment and Plan / ED Course  I have reviewed the triage vital signs and the nursing notes.  Pertinent labs & imaging results that were available during my care of the patient were reviewed by me and considered in my medical  decision making (see chart for details).  Clinical Course  Patient is overall well-appearing.  Abdominal exam is benign.  Urine and urine pregnancy are negative.  Discharge home in good condition.  Home with nausea medication    Final Clinical Impressions(s) / ED Diagnoses   Final diagnoses:  None    New Prescriptions New Prescriptions   No medications on file    I personally performed the services described in this documentation, which was scribed in my presence. The recorded information has been reviewed and is accurate.        Azalia BilisKevin Nazaire Cordial, MD 05/05/16 2038

## 2016-05-05 NOTE — ED Triage Notes (Addendum)
Pt c/o abd pain and vomiting x 1 day  aslo ? scabies

## 2017-09-15 ENCOUNTER — Emergency Department (HOSPITAL_BASED_OUTPATIENT_CLINIC_OR_DEPARTMENT_OTHER)
Admission: EM | Admit: 2017-09-15 | Discharge: 2017-09-16 | Disposition: A | Discharge status: Home / Self Care | Payer: Medicaid Other | Attending: Emergency Medicine | Admitting: Emergency Medicine

## 2017-09-15 ENCOUNTER — Emergency Department (HOSPITAL_BASED_OUTPATIENT_CLINIC_OR_DEPARTMENT_OTHER): Payer: Medicaid Other

## 2017-09-15 ENCOUNTER — Encounter (HOSPITAL_BASED_OUTPATIENT_CLINIC_OR_DEPARTMENT_OTHER): Payer: Self-pay | Admitting: Emergency Medicine

## 2017-09-15 ENCOUNTER — Other Ambulatory Visit: Payer: Self-pay

## 2017-09-15 DIAGNOSIS — N739 Female pelvic inflammatory disease, unspecified: Secondary | ICD-10-CM | POA: Insufficient documentation

## 2017-09-15 DIAGNOSIS — R319 Hematuria, unspecified: Secondary | ICD-10-CM | POA: Diagnosis not present

## 2017-09-15 DIAGNOSIS — F17210 Nicotine dependence, cigarettes, uncomplicated: Secondary | ICD-10-CM | POA: Diagnosis not present

## 2017-09-15 DIAGNOSIS — R1031 Right lower quadrant pain: Secondary | ICD-10-CM | POA: Diagnosis present

## 2017-09-15 DIAGNOSIS — R102 Pelvic and perineal pain: Secondary | ICD-10-CM | POA: Insufficient documentation

## 2017-09-15 DIAGNOSIS — N730 Acute parametritis and pelvic cellulitis: Secondary | ICD-10-CM

## 2017-09-15 LAB — CBC WITH DIFFERENTIAL/PLATELET
BASOS ABS: 0 10*3/uL (ref 0.0–0.1)
Basophils Relative: 0 %
EOS PCT: 2 %
Eosinophils Absolute: 0.2 10*3/uL (ref 0.0–0.7)
HCT: 35.5 % — ABNORMAL LOW (ref 36.0–46.0)
Hemoglobin: 11.6 g/dL — ABNORMAL LOW (ref 12.0–15.0)
LYMPHS PCT: 20 %
Lymphs Abs: 2 10*3/uL (ref 0.7–4.0)
MCH: 31.5 pg (ref 26.0–34.0)
MCHC: 32.7 g/dL (ref 30.0–36.0)
MCV: 96.5 fL (ref 78.0–100.0)
Monocytes Absolute: 0.8 10*3/uL (ref 0.1–1.0)
Monocytes Relative: 8 %
NEUTROS ABS: 7 10*3/uL (ref 1.7–7.7)
NEUTROS PCT: 70 %
Platelets: 350 10*3/uL (ref 150–400)
RBC: 3.68 MIL/uL — AB (ref 3.87–5.11)
RDW: 13.8 % (ref 11.5–15.5)
WBC: 10 10*3/uL (ref 4.0–10.5)

## 2017-09-15 LAB — URINALYSIS, ROUTINE W REFLEX MICROSCOPIC
Glucose, UA: NEGATIVE mg/dL
KETONES UR: 15 mg/dL — AB
NITRITE: POSITIVE — AB
PROTEIN: 100 mg/dL — AB
Specific Gravity, Urine: 1.025 (ref 1.005–1.030)
pH: 6.5 (ref 5.0–8.0)

## 2017-09-15 LAB — COMPREHENSIVE METABOLIC PANEL
ALT: 13 U/L — ABNORMAL LOW (ref 14–54)
ANION GAP: 8 (ref 5–15)
AST: 18 U/L (ref 15–41)
Albumin: 3.9 g/dL (ref 3.5–5.0)
Alkaline Phosphatase: 50 U/L (ref 38–126)
BILIRUBIN TOTAL: 0.5 mg/dL (ref 0.3–1.2)
BUN: 7 mg/dL (ref 6–20)
CO2: 23 mmol/L (ref 22–32)
Calcium: 8.7 mg/dL — ABNORMAL LOW (ref 8.9–10.3)
Chloride: 105 mmol/L (ref 101–111)
Creatinine, Ser: 0.84 mg/dL (ref 0.44–1.00)
Glucose, Bld: 119 mg/dL — ABNORMAL HIGH (ref 65–99)
Potassium: 3.3 mmol/L — ABNORMAL LOW (ref 3.5–5.1)
Sodium: 136 mmol/L (ref 135–145)
TOTAL PROTEIN: 7.2 g/dL (ref 6.5–8.1)

## 2017-09-15 LAB — WET PREP, GENITAL
Sperm: NONE SEEN
Trich, Wet Prep: NONE SEEN
Yeast Wet Prep HPF POC: NONE SEEN

## 2017-09-15 LAB — URINALYSIS, MICROSCOPIC (REFLEX)

## 2017-09-15 LAB — PREGNANCY, URINE: PREG TEST UR: NEGATIVE

## 2017-09-15 MED ORDER — DOXYCYCLINE HYCLATE 100 MG PO CAPS: 100.0000 mg | ORAL_CAPSULE | Freq: Two times a day (BID) | ORAL | 0 refills | Status: AC

## 2017-09-15 MED ORDER — ACETAMINOPHEN 325 MG PO TABS
650.0000 mg | ORAL_TABLET | Freq: Once | ORAL | Status: AC
  Administered 2017-09-15: 650 mg via ORAL
  Filled 2017-09-15: qty 2

## 2017-09-15 MED ORDER — METRONIDAZOLE 500 MG PO TABS
500.0000 mg | ORAL_TABLET | Freq: Once | ORAL | Status: AC
  Administered 2017-09-15: 500 mg via ORAL
  Filled 2017-09-15: qty 1

## 2017-09-15 MED ORDER — POTASSIUM CHLORIDE CRYS ER 20 MEQ PO TBCR
40.0000 meq | EXTENDED_RELEASE_TABLET | Freq: Once | ORAL | Status: AC
  Administered 2017-09-15: 40 meq via ORAL
  Filled 2017-09-15: qty 2

## 2017-09-15 MED ORDER — METRONIDAZOLE 500 MG PO TABS: 500.0000 mg | ORAL_TABLET | Freq: Two times a day (BID) | ORAL | 0 refills | Status: AC

## 2017-09-15 MED ORDER — DOXYCYCLINE HYCLATE 100 MG PO TABS
100.0000 mg | ORAL_TABLET | Freq: Once | ORAL | Status: AC
  Administered 2017-09-15: 100 mg via ORAL
  Filled 2017-09-15: qty 1

## 2017-09-15 MED ORDER — SODIUM CHLORIDE 0.9 % IV BOLUS (SEPSIS)
1000.0000 mL | Freq: Once | INTRAVENOUS | Status: AC
  Administered 2017-09-15: 1000 mL via INTRAVENOUS

## 2017-09-15 MED ORDER — CEFTRIAXONE SODIUM 250 MG IJ SOLR
250.0000 mg | Freq: Once | INTRAMUSCULAR | Status: AC
  Administered 2017-09-15: 250 mg via INTRAMUSCULAR
  Filled 2017-09-15: qty 250

## 2017-09-15 NOTE — ED Notes (Signed)
Patient transported to Ultrasound 

## 2017-09-15 NOTE — ED Triage Notes (Signed)
Pt presents with c/o RLQ pain and R lower back pain that started today with diarrhea.

## 2017-09-15 NOTE — ED Provider Notes (Signed)
[facility] EMERGENCY DEPARTMENT Provider Note   CSN: [id number] Arrival date & time: 09/15/17  1854     History   Chief Complaint Chief Complaint  Patient presents with  . Abdominal Pain    HPI Levy SjogrenBernice Mccoy is a 37 y.o. female.  HPI  37 year old female presents with right lower quadrant pain.  Started yesterday but was not too bad.  However all of a sudden became worse this afternoon at around 5 PM.  It is a sharp pain.  It does not radiate.  It is in her right lower abdomen.  It does feel like pain she gets with her menstrual cycle.  However she is not bleeding and her last cycle was about a week ago.  She has not had any fevers, nausea, vomiting.  She has had a few loose stools.  She denies any hematuria, dysuria or foul-smelling odor.  However when she urinated in a cup she stated it was dark.  She has taken ibuprofen the took the pain from an 8 down to a 5.  She gave birth in October.  She is not breast-feeding.  No missed menstrual cycles.  History reviewed. No pertinent past medical history.  There are no active problems to display for this patient.   History reviewed. No pertinent surgical history.  OB History    Gravida Para Term Preterm AB Living   1             SAB TAB Ectopic Multiple Live Births                   Home Medications    Prior to Admission medications   Medication Sig Start Date End Date Taking? Authorizing Provider  doxycycline (VIBRAMYCIN) 100 MG capsule Take 1 capsule (100 mg total) by mouth 2 (two) times daily for 14 days. One po bid x 7 days 09/15/17 09/29/17  Pricilla LovelessGoldston, Jahlia Omura, MD  metroNIDAZOLE (FLAGYL) 500 MG tablet Take 1 tablet (500 mg total) by mouth 2 (two) times daily. One po bid x 7 days 09/15/17   Pricilla LovelessGoldston, Terri Malerba, MD  ondansetron (ZOFRAN ODT) 8 MG disintegrating tablet Take 1 tablet (8 mg total) by mouth every 8 (eight) hours as needed for nausea or vomiting. 05/05/16   Azalia Bilisampos, Kevin, MD    Family History No family history  on file.  Social History Social History   Tobacco Use  . Smoking status: Current Every Day Smoker    Packs/day: 0.50    Types: Cigarettes  Substance Use Topics  . Alcohol use: Yes  . Drug use: No     Allergies   Morphine and related   Review of Systems Review of Systems  Constitutional: Negative for fever.  Respiratory: Negative for shortness of breath.   Gastrointestinal: Positive for abdominal pain and diarrhea. Negative for nausea and vomiting.  Genitourinary: Negative for dysuria, frequency, hematuria, menstrual problem, vaginal bleeding, vaginal discharge and vaginal pain.  All other systems reviewed and are negative.    Physical Exam Updated Vital Signs BP 130/78 (BP Location: Left Arm)   Pulse 76   Temp 98.3 F (36.8 C) (Oral)   Resp 18   Ht 5\' 4"  (1.626 m)   Wt 56.7 kg (125 lb)   SpO2 100%   BMI 21.46 kg/m   Physical Exam  Constitutional: She is oriented to person, place, and time. She appears well-developed and well-nourished.  Non-toxic appearance. She does not appear ill. No distress.  HENT:  Head: Normocephalic and atraumatic.  Right Ear: External ear normal.  Left Ear: External ear normal.  Nose: Nose normal.  Eyes: Right eye exhibits no discharge. Left eye exhibits no discharge.  Cardiovascular: Normal rate, regular rhythm and normal heart sounds.  Pulmonary/Chest: Effort normal and breath sounds normal.  Abdominal: Soft. There is tenderness in the right lower quadrant. There is no CVA tenderness.    Genitourinary: Cervix exhibits motion tenderness. Vaginal discharge (brown) found.  Neurological: She is alert and oriented to person, place, and time.  Skin: Skin is warm and dry.  Nursing note and vitals reviewed.    ED Treatments / Results  Labs (all labs ordered are listed, but only abnormal results are displayed) Labs Reviewed  WET PREP, GENITAL - Abnormal; Notable for the following components:      Result Value   Clue Cells Wet Prep  HPF POC PRESENT (*)    WBC, Wet Prep HPF POC MODERATE (*)    All other components within normal limits  URINALYSIS, ROUTINE W REFLEX MICROSCOPIC - Abnormal; Notable for the following components:   Color, Urine BROWN (*)    APPearance TURBID (*)    Hgb urine dipstick LARGE (*)    Bilirubin Urine MODERATE (*)    Ketones, ur 15 (*)    Protein, ur 100 (*)    Nitrite POSITIVE (*)    Leukocytes, UA SMALL (*)    All other components within normal limits  URINALYSIS, MICROSCOPIC (REFLEX) - Abnormal; Notable for the following components:   Bacteria, UA FEW (*)    Squamous Epithelial / LPF 6-30 (*)    All other components within normal limits  COMPREHENSIVE METABOLIC PANEL - Abnormal; Notable for the following components:   Potassium 3.3 (*)    Glucose, Bld 119 (*)    Calcium 8.7 (*)    ALT 13 (*)    All other components within normal limits  CBC WITH DIFFERENTIAL/PLATELET - Abnormal; Notable for the following components:   RBC 3.68 (*)    Hemoglobin 11.6 (*)    HCT 35.5 (*)    All other components within normal limits  URINE CULTURE  PREGNANCY, URINE  GC/CHLAMYDIA PROBE AMP ([place]) NOT AT [facility]    EKG  EKG Interpretation None       Radiology US Transvaginal Non-ob  Result Date: 09/15/2017 CLINICAL DATA:  INITIAL EVALUATION FOR ACUTE RIGHT PELVIC PAIN FOR 1 DAY. EXAM: TRANSABDOMINAL AND TRANSVAGINAL ULTRASOUND OF PELVIS DOPPLER ULTRASOUND OF OVARIES TECHNIQUE: Both transabdominal and transvaginal ultrasound examinations of the pelvis were performed. Transabdominal technique was performed for global imaging of the pelvis including uterus, ovaries, adnexal regions, and pelvic cul-de-sac. It was necessary to proceed with endovaginal exam following the transabdominal exam to visualize the UTERUS AND OVARIES. Color and duplex Doppler ultrasound was utilized to evaluate blood flow to the ovaries. COMPARISON:  None. FINDINGS: Uterus Measurements: 8.7 X 6.0 X 7.5 cm. No fibroids or other  mass visualized. Endometrium Thickness: 11 mm.  No focal abnormality visualized. Right ovary Measurements: 3.5 x 1.9 x 1.8 cm. Normal appearance/no adnexal mass. Left ovary Measurements: 4.1 x 2.8 x 2.3 cm. Normal appearance/no adnexal mass. Small corpus luteal cyst noted. Pulsed Doppler evaluation of both ovaries demonstrates normal low-resistance arterial and venous waveforms. Other findings Small volume free fluid adjacent to the left ovary. IMPRESSION: 1. Left ovarian corpus luteal cyst with adjacent small volume free physiologic fluid. 2. Otherwise unremarkable and normal pelvic ultrasound. No acute abnormality identified. No evidence for torsion. Electronically Signed   By:  Rise Mu M.D.   On: 09/15/2017 23:29   US Pelvis Complete  Result Date: 09/15/2017 CLINICAL DATA:  INITIAL EVALUATION FOR ACUTE RIGHT PELVIC PAIN FOR 1 DAY. EXAM: TRANSABDOMINAL AND TRANSVAGINAL ULTRASOUND OF PELVIS DOPPLER ULTRASOUND OF OVARIES TECHNIQUE: Both transabdominal and transvaginal ultrasound examinations of the pelvis were performed. Transabdominal technique was performed for global imaging of the pelvis including uterus, ovaries, adnexal regions, and pelvic cul-de-sac. It was necessary to proceed with endovaginal exam following the transabdominal exam to visualize the UTERUS AND OVARIES. Color and duplex Doppler ultrasound was utilized to evaluate blood flow to the ovaries. COMPARISON:  None. FINDINGS: Uterus Measurements: 8.7 X 6.0 X 7.5 cm. No fibroids or other mass visualized. Endometrium Thickness: 11 mm.  No focal abnormality visualized. Right ovary Measurements: 3.5 x 1.9 x 1.8 cm. Normal appearance/no adnexal mass. Left ovary Measurements: 4.1 x 2.8 x 2.3 cm. Normal appearance/no adnexal mass. Small corpus luteal cyst noted. Pulsed Doppler evaluation of both ovaries demonstrates normal low-resistance arterial and venous waveforms. Other findings Small volume free fluid adjacent to the left ovary.  IMPRESSION: 1. Left ovarian corpus luteal cyst with adjacent small volume free physiologic fluid. 2. Otherwise unremarkable and normal pelvic ultrasound. No acute abnormality identified. No evidence for torsion. Electronically Signed   By: Rise Mu M.D.   On: 09/15/2017 23:29   [place] Art/ven Flow Abd Pelv Doppler  Result Date: 09/15/2017 CLINICAL DATA:  INITIAL EVALUATION FOR ACUTE RIGHT PELVIC PAIN FOR 1 DAY. EXAM: TRANSABDOMINAL AND TRANSVAGINAL ULTRASOUND OF PELVIS DOPPLER ULTRASOUND OF OVARIES TECHNIQUE: Both transabdominal and transvaginal ultrasound examinations of the pelvis were performed. Transabdominal technique was performed for global imaging of the pelvis including uterus, ovaries, adnexal regions, and pelvic cul-de-sac. It was necessary to proceed with endovaginal exam following the transabdominal exam to visualize the UTERUS AND OVARIES. Color and duplex Doppler ultrasound was utilized to evaluate blood flow to the ovaries. COMPARISON:  None. FINDINGS: Uterus Measurements: 8.7 X 6.0 X 7.5 cm. No fibroids or other mass visualized. Endometrium Thickness: 11 mm.  No focal abnormality visualized. Right ovary Measurements: 3.5 x 1.9 x 1.8 cm. Normal appearance/no adnexal mass. Left ovary Measurements: 4.1 x 2.8 x 2.3 cm. Normal appearance/no adnexal mass. Small corpus luteal cyst noted. Pulsed Doppler evaluation of both ovaries demonstrates normal low-resistance arterial and venous waveforms. Other findings Small volume free fluid adjacent to the left ovary. IMPRESSION: 1. Left ovarian corpus luteal cyst with adjacent small volume free physiologic fluid. 2. Otherwise unremarkable and normal pelvic ultrasound. No acute abnormality identified. No evidence for torsion. Electronically Signed   By: Rise Mu M.D.   On: 09/15/2017 23:29    Procedures Procedures (including critical care time)  Medications Ordered in ED Medications  sodium chloride 0.9 % bolus 1,000 mL (0 mLs  Intravenous Stopped 09/15/17 2311)  acetaminophen (TYLENOL) tablet 650 mg (650 mg Oral Given 09/15/17 2200)  cefTRIAXone (ROCEPHIN) injection 250 mg (250 mg Intramuscular Given 09/15/17 2343)  doxycycline (VIBRA-TABS) tablet 100 mg (100 mg Oral Given 09/15/17 2342)  metroNIDAZOLE (FLAGYL) tablet 500 mg (500 mg Oral Given 09/15/17 2343)  potassium chloride SA (K-DUR,KLOR-CON) CR tablet 40 mEq (40 mEq Oral Given 09/15/17 2343)     Initial Impression / Assessment and Plan / ED Course  I have reviewed the triage vital signs and the nursing notes.  Pertinent labs & imaging results that were available during my care of the patient were reviewed by me and considered in my medical decision making (see chart for  details).     Patient symptoms are probably from PID.  She was very uncomfortable during the pelvic exam and has brown discharge.  Ultrasound does not show an obvious TOA but she does have a left-sided cyst which would not correlate with her symptoms of right-sided pain.  With the hematuria, we will get a CT stone study to help rule out ureteral colic and/or appendicitis.  Care to Dr. Nicanor Alcon with CT pending.  If negative, treat as PID and follow-up with GYN.  Final Clinical Impressions(s) / ED Diagnoses   Final diagnoses:  None    ED Discharge Orders        Ordered    doxycycline (VIBRAMYCIN) 100 MG capsule  2 times daily     09/15/17 2344    metroNIDAZOLE (FLAGYL) 500 MG tablet  2 times daily     09/15/17 2344       Pricilla Loveless, MD 09/15/17 2351

## 2017-09-16 LAB — GC/CHLAMYDIA PROBE AMP (~~LOC~~) NOT AT ARMC
CHLAMYDIA, DNA PROBE: NEGATIVE
NEISSERIA GONORRHEA: NEGATIVE

## 2017-09-17 LAB — URINE CULTURE: CULTURE: NO GROWTH

## 2019-06-27 IMAGING — US US PELVIS COMPLETE
1 series · 13 of 25 positions shown · non-contrast
Comparison: None.

CLINICAL DATA: INITIAL EVALUATION FOR ACUTE RIGHT PELVIC PAIN FOR 1
DAY.

EXAM:
TRANSABDOMINAL AND TRANSVAGINAL ULTRASOUND OF PELVIS
DOPPLER ULTRASOUND OF OVARIES
TECHNIQUE: Both transabdominal and transvaginal ultrasound examinations of the
pelvis were performed. Transabdominal technique was performed for
global imaging of the pelvis including uterus, ovaries, adnexal
regions, and pelvic cul-de-sac.
It was necessary to proceed with endovaginal exam following the
transabdominal exam to visualize the UTERUS AND OVARIES. Color and
duplex Doppler ultrasound was utilized to evaluate blood flow to the
ovaries.

[Series 1: us pelvis complete · 0.22mm/px · 13 of 40 slices shown]
[im 1/40]
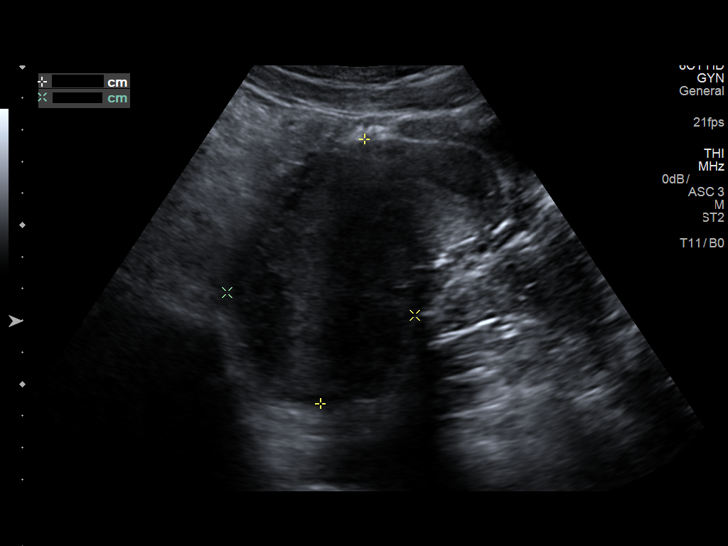
[im 4/40]
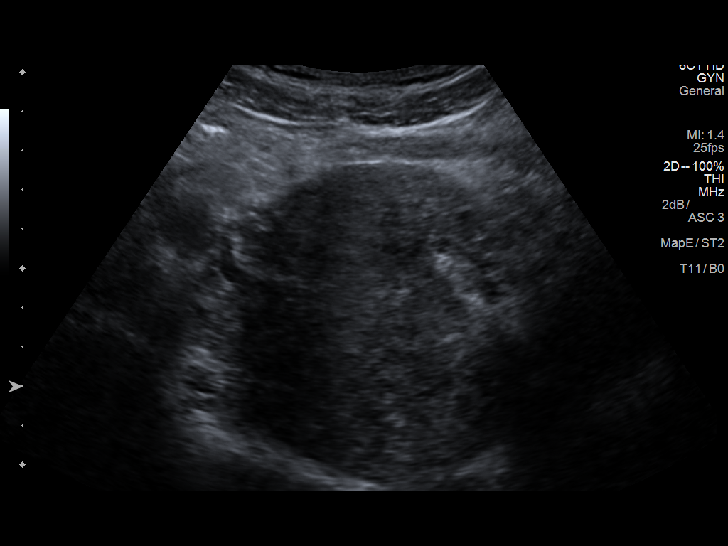
[im 7/40]
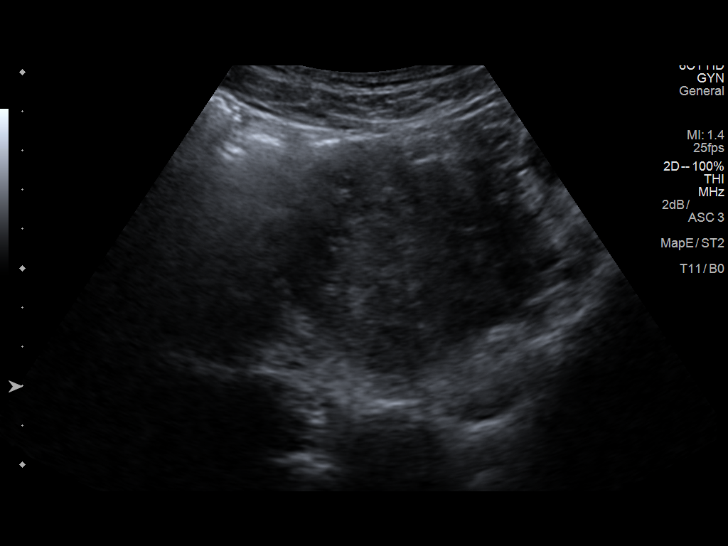
[im 10/40]
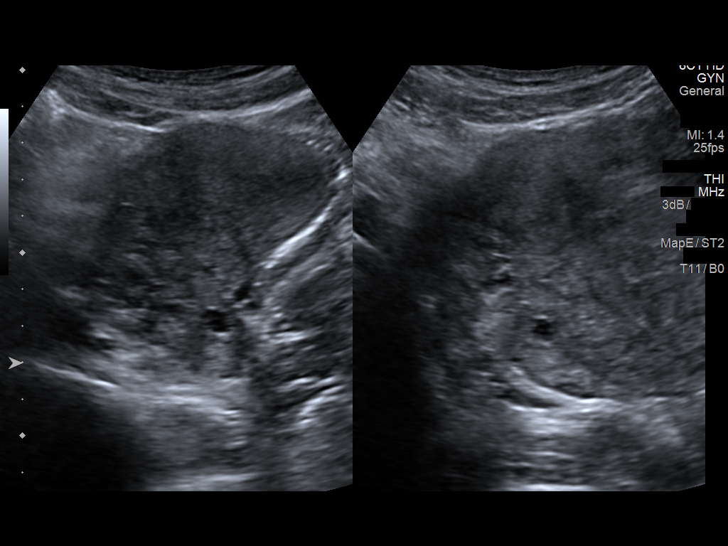
[im 14/40]
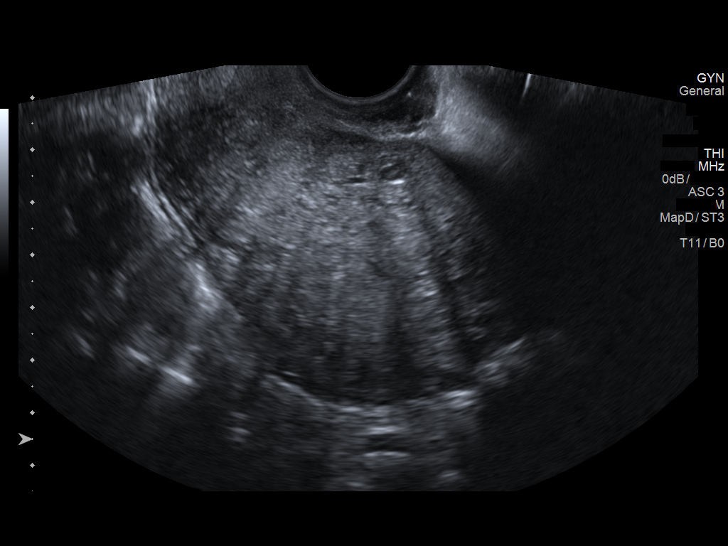
[im 17/40]
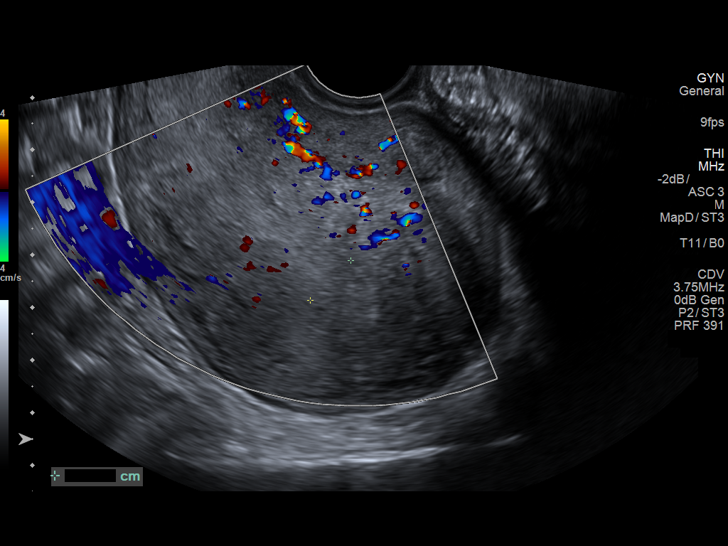
[im 20/40]
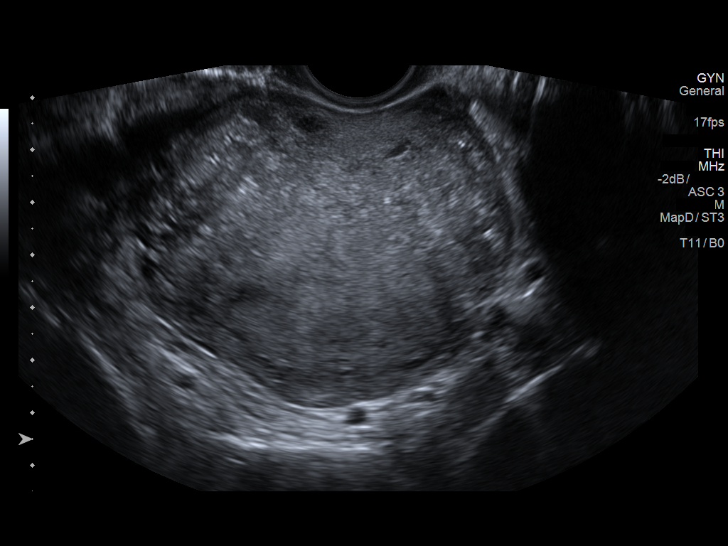
[im 23/40]
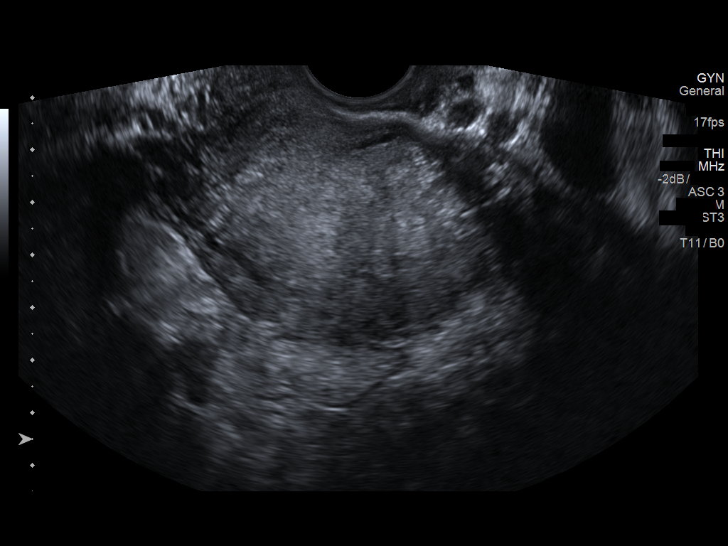
[im 27/40]
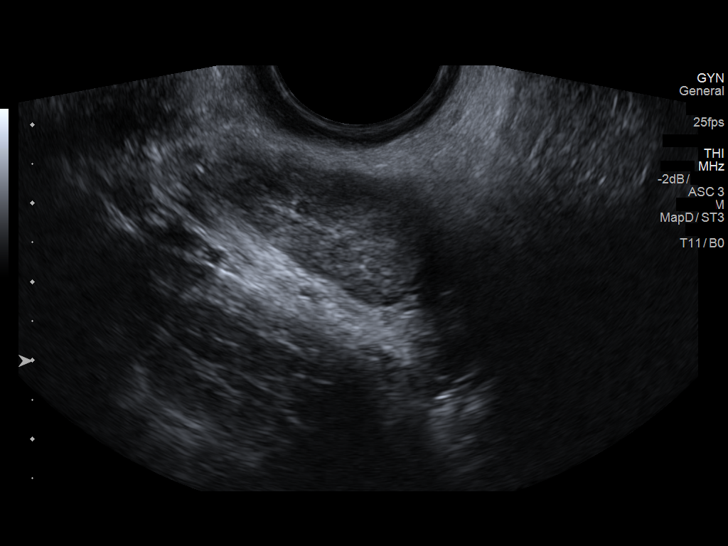
[im 30/40]
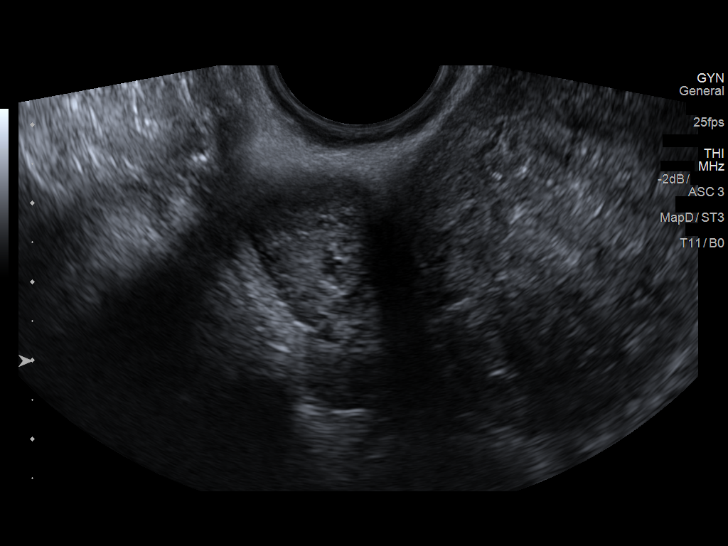
[im 33/40]
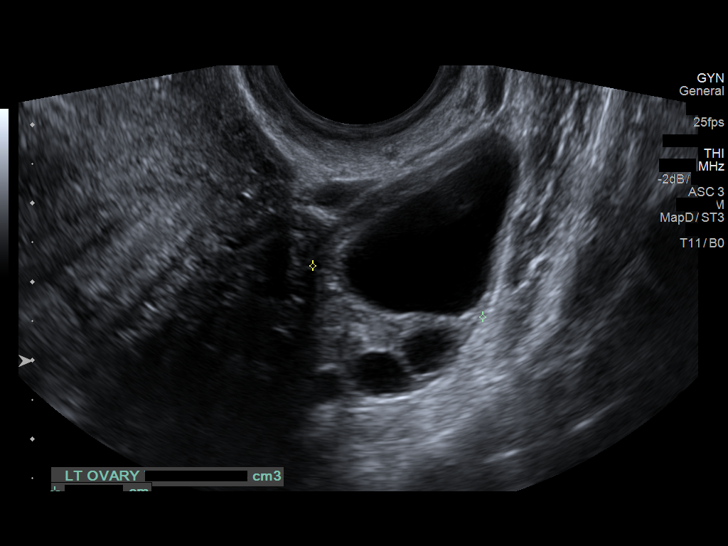
[im 36/40]
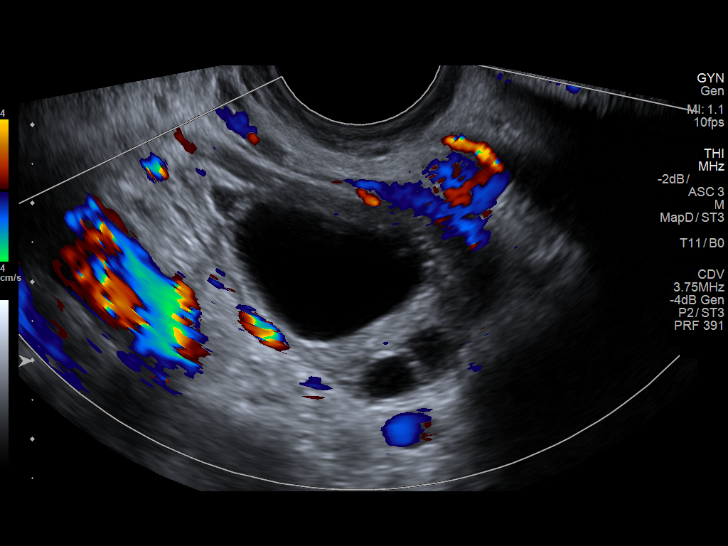
[im 40/40]
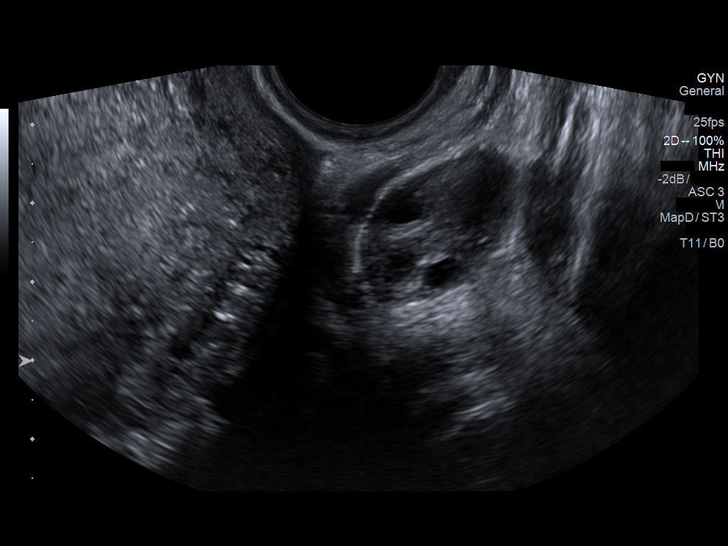

[13 of 25 positions shown; findings below may reference images not displayed]

FINDINGS: Uterus

Measurements: 8.7 X 6.0 X 7.5 cm. No fibroids or other mass
visualized.

Endometrium

Thickness: 11 mm.  No focal abnormality visualized.

Right ovary

Measurements: 3.5 x 1.9 x 1.8 cm. Normal appearance/no adnexal mass.

Left ovary

Measurements: 4.1 x 2.8 x 2.3 cm. Normal appearance/no adnexal mass.
Small corpus luteal cyst noted.

Pulsed Doppler evaluation of both ovaries demonstrates normal
low-resistance arterial and venous waveforms.

Other findings

Small volume free fluid adjacent to the left ovary.
IMPRESSION: 1. Left ovarian corpus luteal cyst with adjacent small volume free
physiologic fluid.
2. Otherwise unremarkable and normal pelvic ultrasound. No acute
abnormality identified. No evidence for torsion.

## 2019-10-15 ENCOUNTER — Emergency Department (HOSPITAL_BASED_OUTPATIENT_CLINIC_OR_DEPARTMENT_OTHER)
Admission: EM | Admit: 2019-10-15 | Discharge: 2019-10-15 | Disposition: A | Discharge status: Home / Self Care | Payer: Medicaid Other | Attending: Emergency Medicine | Admitting: Emergency Medicine

## 2019-10-15 ENCOUNTER — Emergency Department (HOSPITAL_BASED_OUTPATIENT_CLINIC_OR_DEPARTMENT_OTHER): Payer: Medicaid Other

## 2019-10-15 ENCOUNTER — Other Ambulatory Visit: Payer: Self-pay

## 2019-10-15 ENCOUNTER — Encounter (HOSPITAL_BASED_OUTPATIENT_CLINIC_OR_DEPARTMENT_OTHER): Payer: Self-pay | Admitting: Emergency Medicine

## 2019-10-15 DIAGNOSIS — O208 Other hemorrhage in early pregnancy: Secondary | ICD-10-CM | POA: Diagnosis not present

## 2019-10-15 DIAGNOSIS — Z3A01 Less than 8 weeks gestation of pregnancy: Secondary | ICD-10-CM | POA: Diagnosis not present

## 2019-10-15 DIAGNOSIS — F17210 Nicotine dependence, cigarettes, uncomplicated: Secondary | ICD-10-CM | POA: Diagnosis not present

## 2019-10-15 DIAGNOSIS — O4690 Antepartum hemorrhage, unspecified, unspecified trimester: Secondary | ICD-10-CM

## 2019-10-15 DIAGNOSIS — O209 Hemorrhage in early pregnancy, unspecified: Secondary | ICD-10-CM | POA: Diagnosis present

## 2019-10-15 DIAGNOSIS — R1030 Lower abdominal pain, unspecified: Secondary | ICD-10-CM | POA: Insufficient documentation

## 2019-10-15 DIAGNOSIS — Z885 Allergy status to narcotic agent status: Secondary | ICD-10-CM | POA: Diagnosis not present

## 2019-10-15 LAB — URINALYSIS, ROUTINE W REFLEX MICROSCOPIC
Bilirubin Urine: NEGATIVE
Glucose, UA: NEGATIVE mg/dL
Ketones, ur: NEGATIVE mg/dL
Leukocytes,Ua: NEGATIVE
Nitrite: NEGATIVE
Protein, ur: NEGATIVE mg/dL
Specific Gravity, Urine: >1.03 — ABNORMAL HIGH (ref 1.005–1.030)
pH: 6 (ref 5.0–8.0)

## 2019-10-15 LAB — BASIC METABOLIC PANEL
Anion gap: 4 — ABNORMAL LOW (ref 5–15)
BUN: 6 mg/dL (ref 6–20)
CO2: 25 mmol/L (ref 22–32)
Calcium: 9.1 mg/dL (ref 8.9–10.3)
Chloride: 106 mmol/L (ref 98–111)
Creatinine, Ser: 0.7 mg/dL (ref 0.44–1.00)
GFR calc Af Amer: >60 mL/min (ref >60)
GFR calc non Af Amer: >60 mL/min (ref >60)
Glucose, Bld: 87 mg/dL (ref 70–99)
Potassium: 3.6 mmol/L (ref 3.5–5.1)
Sodium: 135 mmol/L (ref 135–145)

## 2019-10-15 LAB — CBC WITH DIFFERENTIAL/PLATELET
Abs Immature Granulocytes: 0.05 10*3/uL (ref 0.00–0.07)
Basophils Absolute: 0 10*3/uL (ref 0.0–0.1)
Basophils Relative: 1 %
Eosinophils Absolute: 0.1 10*3/uL (ref 0.0–0.5)
Eosinophils Relative: 2 %
HCT: 39.7 % (ref 36.0–46.0)
Hemoglobin: 12.8 g/dL (ref 12.0–15.0)
Immature Granulocytes: 1 %
Lymphocytes Relative: 43 %
Lymphs Abs: 2.9 10*3/uL (ref 0.7–4.0)
MCH: 32.2 pg (ref 26.0–34.0)
MCHC: 32.2 g/dL (ref 30.0–36.0)
MCV: 100 fL (ref 80.0–100.0)
Monocytes Absolute: 0.7 10*3/uL (ref 0.1–1.0)
Monocytes Relative: 10 %
Neutro Abs: 2.9 10*3/uL (ref 1.7–7.7)
Neutrophils Relative %: 43 %
Platelets: 312 10*3/uL (ref 150–400)
RBC: 3.97 MIL/uL (ref 3.87–5.11)
RDW: 11.7 % (ref 11.5–15.5)
WBC: 6.7 10*3/uL (ref 4.0–10.5)
nRBC: 0 % (ref 0.0–0.2)

## 2019-10-15 LAB — URINALYSIS, MICROSCOPIC (REFLEX)

## 2019-10-15 LAB — WET PREP, GENITAL
Sperm: NONE SEEN
Trich, Wet Prep: NONE SEEN
Yeast Wet Prep HPF POC: NONE SEEN

## 2019-10-15 LAB — PREGNANCY, URINE: Preg Test, Ur: POSITIVE — AB

## 2019-10-15 LAB — HCG, QUANTITATIVE, PREGNANCY: hCG, Beta Chain, Quant, S: 2630 m[IU]/mL — ABNORMAL HIGH (ref <5)

## 2019-10-15 NOTE — Discharge Instructions (Addendum)
As discussed, your hormone level was 2,630 which is roughly 3-[redacted] weeks pregnant. Your ultrasounds showed probable intrauterine (in the uterus) pregnancy, but it was too early to fully tell. Call your OBGYN on Monday to get a follow-up hormone level on Monday. You will also need a follow-up ultrasound in 14 days. Your ultrasound showed a right ovarian cyst which is normal and a 21mm fibroid in your left uterine body. Ask your OBGYN about those results. You  may take over the counter Tylenol as needed for pain. No ibuprofen. Start a prenatal vitamin as soon as possible. I have included the number for [facility]. If you are unable to get an appointment with your OBGYN on Monday call the clinic to see if they can fit you in for repeat hormone level. Return to the ER for new or worsening symptoms.

## 2019-10-15 NOTE — ED Provider Notes (Signed)
[facility] EMERGENCY DEPARTMENT Provider Note   CSN: [id number] Arrival date & time: 10/15/19  1203     History Chief Complaint  Patient presents with  . Vaginal Bleeding    Morgan Mccoy is a 39 y.o. G3P3 female who presents to the ED due to vaginal bleeding that started this morning associated with lower abdominal cramping x1 week.  Patient states she had a positive pregnancy test last Tuesday.  Patient's LMP was 09/02/2019. Patient admits to bright pink/red vaginal bleeding this morning only when wiping. She was last sexually active 2 weeks ago. Denies vaginal discharge and urinary symptoms. Patient admits to intermittent nausea and vomiting. She admits to roughly 3 episodes of non-bloody, non-bilious emesis for the past week. She also admits to frequent non-bloody diarrhea x1 week. She denies fever and chills. Patient denies chest pain and shortness of breath. Denies URI symptoms.     History reviewed. No pertinent past medical history.  There are no problems to display for this patient.   History reviewed. No pertinent surgical history.   OB History    Gravida  1   Para      Term      Preterm      AB      Living        SAB      TAB      Ectopic      Multiple      Live Births              No family history on file.  Social History   Tobacco Use  . Smoking status: Current Every Day Smoker    Packs/day: 0.50    Types: Cigarettes  . Smokeless tobacco: Never Used  Substance Use Topics  . Alcohol use: Yes  . Drug use: No    Home Medications Prior to Admission medications   Medication Sig Start Date End Date Taking? Authorizing Provider  metroNIDAZOLE (FLAGYL) 500 MG tablet Take 1 tablet (500 mg total) by mouth 2 (two) times daily. One po bid x 7 days 09/15/17   Pricilla Loveless, MD  ondansetron (ZOFRAN ODT) 8 MG disintegrating tablet Take 1 tablet (8 mg total) by mouth every 8 (eight) hours as needed for nausea or vomiting. 05/05/16    Azalia Bilis, MD    Allergies    Morphine and related  Review of Systems   Review of Systems  Constitutional: Negative for chills and fever.  Respiratory: Negative for shortness of breath.   Cardiovascular: Negative for chest pain.  Gastrointestinal: Positive for abdominal pain (abdominal cramping), diarrhea, nausea and vomiting. Negative for abdominal distention.  Genitourinary: Positive for vaginal bleeding. Negative for dysuria, flank pain and vaginal discharge.  All other systems reviewed and are negative.   Physical Exam Updated Vital Signs BP 122/83 (BP Location: Left Arm)   Pulse 74   Temp 98.2 F (36.8 C) (Oral)   Resp 16   Ht 5\' 4"  (1.626 m)   Wt 54.4 kg   LMP 09/02/2019   SpO2 100%   BMI 20.60 kg/m   Physical Exam Vitals and nursing note reviewed. Exam conducted with a chaperone present.  Constitutional:      General: She is not in acute distress.    Appearance: She is not ill-appearing.  HENT:     Head: Normocephalic.  Eyes:     Conjunctiva/sclera: Conjunctivae normal.  Cardiovascular:     Rate and Rhythm: Normal rate and regular rhythm.  Pulses: Normal pulses.     Heart sounds: Normal heart sounds. No murmur. No friction rub. No gallop.   Pulmonary:     Effort: Pulmonary effort is normal.     Breath sounds: Normal breath sounds.  Abdominal:     General: Abdomen is flat. Bowel sounds are normal. There is no distension.     Palpations: Abdomen is soft.     Tenderness: There is abdominal tenderness. There is no right CVA tenderness, left CVA tenderness, guarding or rebound.     Comments: Diffuse abdominal tenderness. No focal tenderness. No rebound or guarding. Negative murphy's sign. No tenderness at Mcburney's point.   Genitourinary:    Exam position: Supine.     Labia:        Right: No lesion.        Left: No lesion.      Vagina: Bleeding present.     Cervix: Cervical bleeding present. No cervical motion tenderness.     Adnexa:        Right:  No tenderness.         Left: Tenderness present.      Comments: Mild amount of blood coming from cervix. No CMT. Mild left adnexal tenderness. No mass appreciated. Exam performed with chaperone in room.  Musculoskeletal:     Cervical back: Neck supple.     Comments: Able to move all 4 extremities without difficulty. No lower extremity edema.   Skin:    General: Skin is warm and dry.  Neurological:     General: No focal deficit present.     Mental Status: She is alert.     ED Results / Procedures / Treatments   Labs (all labs ordered are listed, but only abnormal results are displayed) Labs Reviewed  WET PREP, GENITAL - Abnormal; Notable for the following components:      Result Value   Clue Cells Wet Prep HPF POC PRESENT (*)    WBC, Wet Prep HPF POC MODERATE (*)    All other components within normal limits  URINALYSIS, ROUTINE W REFLEX MICROSCOPIC - Abnormal; Notable for the following components:   APPearance CLOUDY (*)    Specific Gravity, Urine >1.030 (*)    Hgb urine dipstick LARGE (*)    All other components within normal limits  PREGNANCY, URINE - Abnormal; Notable for the following components:   Preg Test, Ur POSITIVE (*)    All other components within normal limits  HCG, QUANTITATIVE, PREGNANCY - Abnormal; Notable for the following components:   hCG, Beta Chain, Quant, S 2,630 (*)    All other components within normal limits  BASIC METABOLIC PANEL - Abnormal; Notable for the following components:   Anion gap 4 (*)    All other components within normal limits  URINALYSIS, MICROSCOPIC (REFLEX) - Abnormal; Notable for the following components:   Bacteria, UA RARE (*)    All other components within normal limits  CBC WITH DIFFERENTIAL/PLATELET  GC/CHLAMYDIA PROBE AMP ([facility]) NOT AT [facility]    EKG None  Radiology US OB Comp < 14 Wks  Result Date: 10/15/2019 CLINICAL DATA:  Pregnant patient with bleeding.  Pain. EXAM: OBSTETRIC <14 WK [place] AND TRANSVAGINAL OB US  TECHNIQUE: Both transabdominal and transvaginal ultrasound examinations were performed for complete evaluation of the gestation as well as the maternal uterus, adnexal regions, and pelvic cul-de-sac. Transvaginal technique was performed to assess early pregnancy. COMPARISON:  None. FINDINGS: Intrauterine gestational sac: Single Yolk sac:  No definitive yolk sac noted.  Embryo:  Not Visualized. MSD: 5.4 mm   5 w   2 d CRL:    mm    w    d                  [place] EDC: Subchorionic hemorrhage:  None visualized. Maternal uterus/adnexae: The left ovary is normal in appearance. 2 cm corpus luteum cyst on the right. Retroflexed uterus. 9 mm fibroid in the left uterine body. IMPRESSION: 1. Probable early intrauterine gestational sac, but no yolk sac, fetal pole, or cardiac activity yet visualized. Recommend follow-up quantitative B-HCG levels and follow-up US in 14 days to assess viability. This recommendation follows SRU consensus guidelines: Diagnostic Criteria for Nonviable Pregnancy Early in the First Trimester. Malva Limes Med 2013; 562:1308-65. 2. Corpus luteum cyst in the right ovary. 3. 9 mm fibroid in the left uterine body. Electronically Signed   By: Gerome Sam III M.D   On: 10/15/2019 13:49   US OB Transvaginal  Result Date: 10/15/2019 CLINICAL DATA:  Pregnant patient with bleeding.  Pain. EXAM: OBSTETRIC <14 WK [place] AND TRANSVAGINAL OB US TECHNIQUE: Both transabdominal and transvaginal ultrasound examinations were performed for complete evaluation of the gestation as well as the maternal uterus, adnexal regions, and pelvic cul-de-sac. Transvaginal technique was performed to assess early pregnancy. COMPARISON:  None. FINDINGS: Intrauterine gestational sac: Single Yolk sac:  No definitive yolk sac noted. Embryo:  Not Visualized. MSD: 5.4 mm   5 w   2 d CRL:    mm    w    d                  [place] EDC: Subchorionic hemorrhage:  None visualized. Maternal uterus/adnexae: The left ovary is normal in appearance. 2 cm corpus  luteum cyst on the right. Retroflexed uterus. 9 mm fibroid in the left uterine body. IMPRESSION: 1. Probable early intrauterine gestational sac, but no yolk sac, fetal pole, or cardiac activity yet visualized. Recommend follow-up quantitative B-HCG levels and follow-up US in 14 days to assess viability. This recommendation follows SRU consensus guidelines: Diagnostic Criteria for Nonviable Pregnancy Early in the First Trimester. Malva Limes Med 2013; 784:6962-95. 2. Corpus luteum cyst in the right ovary. 3. 9 mm fibroid in the left uterine body. Electronically Signed   By: Gerome Sam III M.D   On: 10/15/2019 13:49    Procedures Procedures (including critical care time)  Medications Ordered in ED Medications - No data to display  ED Course  I have reviewed the triage vital signs and the nursing notes.  Pertinent labs & imaging results that were available during my care of the patient were reviewed by me and considered in my medical decision making (see chart for details).    MDM Rules/Calculators/A&P                     39 year old female presents to the ED due to vaginal bleeding associated with abdominal cramps. Patient had a positive pregnancy test 1 week ago. LMP 09/02/2019. Vitals all within normal limits. Patient in no acute distress and non-ill appearing. Physical exam reassuring with diffuse abdominal tenderness. No focal tenderness. No concern for acute abdomen at this time. Pelvic exam performed with chaperone in room which was significant for slight bleeding. Closed os. No CMT. No concern for PID. Will obtain pregnancy test, routine labs, and [place] to rule out ectopic pregnancy.  Urine pregnancy positive. UA positive for large amount of  hemoglobin, but no signs of infection. CBC unremarkable with no leukocytosis. BMP reassuring with normal renal function and no electrolyte abnormalities. Wet prep positive for clue cells and moderate amount of WBC. Given patient is not complaining of  vaginal discharge or irritation, will hold off on treatment right now. HCG quantitative 2,630. [place] personally reviewed which demonstrates: 1. Probable early intrauterine gestational sac, but no yolk sac,  fetal pole, or cardiac activity yet visualized. Recommend follow-up  quantitative B-HCG levels and follow-up US in 14 days to assess  viability. This recommendation follows SRU consensus guidelines:  Diagnostic Criteria for Nonviable Pregnancy Early in the First  Trimester. [employer] Med 2013; 179:1505-69.  2. Corpus luteum cyst in the right ovary.  3. 9 mm fibroid in the left uterine body.   Will have patient follow-up with a serial quant. Within 48 hours at her OBGYN. Advised patient to have a follow-up US in 14 days. Instructed patient to continue taking prenatal vital and to only take Tylenol as needed for pain. [facility] number given to patient at discharge if she is unable to get into her OBGYN on Monday. Strict ED precautions discussed with patient. Patient states understanding and agrees to plan. Patient discharged home in no acute distress and stable vitals  Final Clinical Impression(s) / ED Diagnoses Final diagnoses:  Vaginal bleeding in pregnancy  [redacted] weeks gestation of pregnancy    Rx / DC Orders ED Discharge Orders    None       Karie Kirks 10/15/19 1540    Blanchie Dessert, MD 10/16/19 0710

## 2019-10-15 NOTE — ED Triage Notes (Signed)
Vaginal bleeding and abd cramping this morning. She had a positive preg test last week.

## 2019-10-17 LAB — GC/CHLAMYDIA PROBE AMP (~~LOC~~) NOT AT ARMC
Chlamydia: NEGATIVE
Neisseria Gonorrhea: NEGATIVE

## 2021-03-08 ENCOUNTER — Emergency Department (HOSPITAL_BASED_OUTPATIENT_CLINIC_OR_DEPARTMENT_OTHER)
Admission: EM | Admit: 2021-03-08 | Discharge: 2021-03-08 | Disposition: A | Discharge status: Home / Self Care | Payer: Medicaid Other | Attending: Emergency Medicine | Admitting: Emergency Medicine

## 2021-03-08 ENCOUNTER — Other Ambulatory Visit (HOSPITAL_BASED_OUTPATIENT_CLINIC_OR_DEPARTMENT_OTHER): Payer: Self-pay

## 2021-03-08 ENCOUNTER — Encounter (HOSPITAL_BASED_OUTPATIENT_CLINIC_OR_DEPARTMENT_OTHER): Payer: Self-pay | Admitting: Emergency Medicine

## 2021-03-08 ENCOUNTER — Other Ambulatory Visit: Payer: Self-pay

## 2021-03-08 DIAGNOSIS — F17210 Nicotine dependence, cigarettes, uncomplicated: Secondary | ICD-10-CM | POA: Diagnosis not present

## 2021-03-08 DIAGNOSIS — H6092 Unspecified otitis externa, left ear: Secondary | ICD-10-CM | POA: Insufficient documentation

## 2021-03-08 DIAGNOSIS — H60502 Unspecified acute noninfective otitis externa, left ear: Secondary | ICD-10-CM

## 2021-03-08 DIAGNOSIS — H9202 Otalgia, left ear: Secondary | ICD-10-CM | POA: Diagnosis present

## 2021-03-08 MED ORDER — IBUPROFEN 600 MG PO TABS
600.0000 mg | ORAL_TABLET | Freq: Four times a day (QID) | ORAL | 0 refills | Status: AC | PRN
  Filled 2021-03-08: qty 30, 8d supply, fill #0

## 2021-03-08 MED ORDER — NEOMYCIN-POLYMYXIN-HC 3.5-10000-1 OT SUSP
4.0000 [drp] | Freq: Three times a day (TID) | OTIC | 0 refills | Status: AC
  Filled 2021-03-08: qty 10, 17d supply, fill #0

## 2021-03-08 NOTE — Discharge Instructions (Addendum)
You have otitis externa. Use ear drops as prescribed.   Please use Tylenol or ibuprofen for pain.  You may use 600 mg ibuprofen every 6 hours or 1000 mg of Tylenol every 6 hours.  You may choose to alternate between the 2.  This would be most effective.  Not to exceed 4 g of Tylenol within 24 hours.  Not to exceed 3200 mg ibuprofen 24 hours.

## 2021-03-08 NOTE — ED Provider Notes (Signed)
[facility] EMERGENCY DEPARTMENT Provider Note   CSN: [id number] Arrival date & time: 03/08/21  1002     History Chief Complaint  Patient presents with   Ear Pain    Morgan Mccoy is a 40 y.o. female.  HPI Patient is a 40 year old well-appearing female with no pertinent past medical history presenting today with left ear pain since Wednesday.  She states that it feels falters pressure.  She states it is uncomfortable chewing and swallowing denies any trauma to the ear.  Denies any discharge no recent swimming but states it has been very itchy on the inside her ear as well.  Denies any fevers chills cough congestion runny nose.  No sick contacts.  No other associate symptoms.  No medications prior to arrival.      History reviewed. No pertinent past medical history.  There are no problems to display for this patient.   History reviewed. No pertinent surgical history.   OB History     Gravida  1   Para      Term      Preterm      AB      Living         SAB      IAB      Ectopic      Multiple      Live Births              No family history on file.  Social History   Tobacco Use   Smoking status: Every Day    Packs/day: 0.50    Pack years: 0.00    Types: Cigarettes   Smokeless tobacco: Never  Substance Use Topics   Alcohol use: Yes   Drug use: No    Home Medications Prior to Admission medications   Medication Sig Start Date End Date Taking? Authorizing Provider  ibuprofen (ADVIL) 600 MG tablet Take 1 tablet (600 mg total) by mouth every 6 (six) hours as needed. 03/08/21  Yes Morgan Warrior S, PA  neomycin-polymyxin-hydrocortisone (CORTISPORIN) 3.5-10000-1 OTIC suspension Place 4 drops into the left ear 3 (three) times daily for 7 days. 03/08/21 03/15/21 Yes Morgan Mccoy S, PA  metroNIDAZOLE (FLAGYL) 500 MG tablet Take 1 tablet (500 mg total) by mouth 2 (two) times daily. One po bid x 7 days 09/15/17   Pricilla Loveless, MD   ondansetron (ZOFRAN ODT) 8 MG disintegrating tablet Take 1 tablet (8 mg total) by mouth every 8 (eight) hours as needed for nausea or vomiting. 05/05/16   Azalia Bilis, MD    Allergies    Morphine and related  Review of Systems   Review of Systems  Constitutional:  Negative for chills and fever.  HENT:  Positive for ear pain. Negative for congestion, postnasal drip, rhinorrhea, sinus pain and sore throat.   Eyes:  Negative for pain and redness.  Respiratory:  Negative for cough.   Cardiovascular:  Negative for chest pain.  Gastrointestinal:  Negative for abdominal pain.  Musculoskeletal:  Negative for neck pain.  Skin:  Negative for rash.  Neurological:  Negative for dizziness and headaches.  Psychiatric/Behavioral:  Negative for sleep disturbance.    Physical Exam Updated Vital Signs BP 122/87 (BP Location: Right Arm)   Pulse 85   Temp 98.6 F (37 C) (Oral)   Resp 18   Ht 5\' 4"  (1.626 m)   Wt 63.5 kg   LMP  (LMP Unknown) Comment: on depo  SpO2 100%   BMI 24.03 kg/m  Physical Exam Vitals and nursing note reviewed.  Constitutional:      General: She is not in acute distress.    Appearance: Normal appearance. She is not ill-appearing.  HENT:     Head: Normocephalic and atraumatic.     Ears:     Comments: Right EAC with wax obstruction which was removed with curette.  Left EAC is macerated with swollen edematous tissue. Eyes:     General: No scleral icterus.       Right eye: No discharge.        Left eye: No discharge.     Conjunctiva/sclera: Conjunctivae normal.  Pulmonary:     Effort: Pulmonary effort is normal.     Breath sounds: No stridor.  Neurological:     Mental Status: She is alert and oriented to person, place, and time. Mental status is at baseline.    ED Results / Procedures / Treatments   Labs (all labs ordered are listed, but only abnormal results are displayed) Labs Reviewed - No data to display  EKG None  Radiology No results  found.  Procedures Procedures   Medications Ordered in ED Medications - No data to display  ED Course  I have reviewed the triage vital signs and the nursing notes.  Pertinent labs & imaging results that were available during my care of the patient were reviewed by me and considered in my medical decision making (see chart for details).    MDM Rules/Calculators/A&P                          Otitis externa left EAC is macerated and edematous.  Not quite edematous enough for any work to be placed.  Unable to evaluate TM we will use Cortisporin suspension which is safe for perforations.  NSAIDs at home.  Final Clinical Impression(Mccoy) / ED Diagnoses Final diagnoses:  Acute otitis externa of left ear, unspecified type    Rx / DC Orders ED Discharge Orders          Ordered    neomycin-polymyxin-hydrocortisone (CORTISPORIN) 3.5-10000-1 OTIC suspension  3 times daily        03/08/21 1042    ibuprofen (ADVIL) 600 MG tablet  Every 6 hours PRN        03/08/21 1042             Morgan Mccoy [place], [place] 03/08/21 1047    Morgan Plan, DO 03/08/21 1059

## 2021-03-08 NOTE — ED Triage Notes (Signed)
Reports left ear pain since Wednesday.  Feels like ear is full. Painful to chew and swallow.  Also reports decreased hearing on that side.

## 2021-03-11 ENCOUNTER — Encounter (HOSPITAL_BASED_OUTPATIENT_CLINIC_OR_DEPARTMENT_OTHER): Payer: Self-pay | Admitting: Emergency Medicine

## 2021-03-11 ENCOUNTER — Emergency Department (HOSPITAL_BASED_OUTPATIENT_CLINIC_OR_DEPARTMENT_OTHER)
Admission: EM | Admit: 2021-03-11 | Discharge: 2021-03-11 | Disposition: A | Discharge status: Home / Self Care | Payer: Medicaid Other | Attending: Emergency Medicine | Admitting: Emergency Medicine

## 2021-03-11 ENCOUNTER — Other Ambulatory Visit: Payer: Self-pay

## 2021-03-11 DIAGNOSIS — F17210 Nicotine dependence, cigarettes, uncomplicated: Secondary | ICD-10-CM | POA: Insufficient documentation

## 2021-03-11 DIAGNOSIS — H60313 Diffuse otitis externa, bilateral: Secondary | ICD-10-CM | POA: Diagnosis not present

## 2021-03-11 DIAGNOSIS — H9203 Otalgia, bilateral: Secondary | ICD-10-CM | POA: Diagnosis present

## 2021-03-11 DIAGNOSIS — R519 Headache, unspecified: Secondary | ICD-10-CM | POA: Insufficient documentation

## 2021-03-11 MED ORDER — AMOXICILLIN 500 MG PO CAPS: 500.0000 mg | ORAL_CAPSULE | Freq: Three times a day (TID) | ORAL | 0 refills | Status: AC

## 2021-03-11 MED ORDER — ACETAMINOPHEN 325 MG PO TABS
650.0000 mg | ORAL_TABLET | Freq: Once | ORAL | Status: AC
  Administered 2021-03-11: 650 mg via ORAL
  Filled 2021-03-11: qty 2

## 2021-03-11 NOTE — Discharge Instructions (Addendum)
Your symptom is consistent with swimmers ears.  Continue to use eardrops to both ears as prescribed.  You may also take antibiotic prescribed.

## 2021-03-11 NOTE — ED Provider Notes (Signed)
[facility] EMERGENCY DEPARTMENT Provider Note   CSN: [id number] Arrival date & time: 03/11/21  1224     History Chief Complaint  Patient presents with   Otalgia    Morgan Mccoy is a 40 y.o. female.  The history is provided by the patient and medical records. No language interpreter was used.  Otalgia Associated symptoms: headaches   Associated symptoms: no fever    40 year old female presenting complaint of ear pain.  Patient report for the past 4 to 5 days she has had progressive worsening pain to both ears with decreased hearing.  Pain is sharp throbbing moderate to severe worse when she lays on the affected side.  Hearing is a bit muffled.  No fever chills runny nose sneezing coughing sore throat.  Was seen in the ED 3 days ago for her symptoms and was diagnosed with otitis externa.  She was prescribed eardrops which she has been using but noticed no improvement.  She is here requesting for additional management.  History reviewed. No pertinent past medical history.  There are no problems to display for this patient.   History reviewed. No pertinent surgical history.   OB History     Gravida  1   Para      Term      Preterm      AB      Living         SAB      IAB      Ectopic      Multiple      Live Births              History reviewed. No pertinent family history.  Social History   Tobacco Use   Smoking status: Every Day    Packs/day: 0.50    Pack years: 0.00    Types: Cigarettes   Smokeless tobacco: Never  Substance Use Topics   Alcohol use: Yes   Drug use: No    Home Medications Prior to Admission medications   Medication Sig Start Date End Date Taking? Authorizing Provider  ibuprofen (ADVIL) 600 MG tablet Take 1 tablet (600 mg total) by mouth every 6 (six) hours as needed. 03/08/21   Gailen Shelter, PA  metroNIDAZOLE (FLAGYL) 500 MG tablet Take 1 tablet (500 mg total) by mouth 2 (two) times daily. One po bid x 7  days 09/15/17   Pricilla Loveless, MD  neomycin-polymyxin-hydrocortisone (CORTISPORIN) 3.5-10000-1 OTIC suspension Place 4 drops into the left ear 3 (three) times daily for 7 days. 03/08/21 03/25/21  Gailen Shelter, PA  ondansetron (ZOFRAN ODT) 8 MG disintegrating tablet Take 1 tablet (8 mg total) by mouth every 8 (eight) hours as needed for nausea or vomiting. 05/05/16   Azalia Bilis, MD    Allergies    Morphine and related  Review of Systems   Review of Systems  Constitutional:  Negative for fever.  HENT:  Positive for ear pain.   Neurological:  Positive for headaches.   Physical Exam Updated Vital Signs BP 122/86 (BP Location: Right Arm)   Pulse 98   Temp 100.1 F (37.8 C) (Oral)   Resp 18   Ht 5\' 4"  (1.626 m)   Wt 63.5 kg   LMP  (LMP Unknown) Comment: on depo  SpO2 100%   BMI 24.03 kg/m   Physical Exam Vitals and nursing note reviewed.  Constitutional:      General: She is not in acute distress.    Appearance: She  is well-developed.  HENT:     Head: Atraumatic.     Ears:     Comments: Bilateral ear canals are erythematous edematous and macerated difficult to visualize TMs.  Tenderness to manipulations of the tragus and the earlobes bilaterally. Eyes:     Conjunctiva/sclera: Conjunctivae normal.  Pulmonary:     Effort: Pulmonary effort is normal.  Musculoskeletal:     Cervical back: Neck supple.  Skin:    Findings: No rash.  Neurological:     Mental Status: She is alert.  Psychiatric:        Mood and Affect: Mood normal.    ED Results / Procedures / Treatments   Labs (all labs ordered are listed, but only abnormal results are displayed) Labs Reviewed - No data to display  EKG None  Radiology No results found.  Procedures Procedures   Medications Ordered in ED Medications  acetaminophen (TYLENOL) tablet 650 mg (650 mg Oral Given 03/11/21 1240)    ED Course  I have reviewed the triage vital signs and the nursing notes.  Pertinent labs & imaging  results that were available during my care of the patient were reviewed by me and considered in my medical decision making (see chart for details).    MDM Rules/Calculators/A&P                          BP 122/86 (BP Location: Right Arm)   Pulse 98   Temp 100.1 F (37.8 C) (Oral)   Resp 18   Ht 5\' 4"  (1.626 m)   Wt 63.5 kg   LMP  (LMP Unknown) Comment: on depo  SpO2 100%   BMI 24.03 kg/m   Final Clinical Impression(s) / ED Diagnoses Final diagnoses:  Acute diffuse otitis externa of both ears    Rx / DC Orders ED Discharge Orders          Ordered    amoxicillin (AMOXIL) 500 MG capsule  3 times daily        03/11/21 1443           2:40 PM Patient here with bilateral ear pain for the past several days.  Finding consistent with otitis externa.  Patient was prescribed Cortisporin eardrops without adequate relief.  At this time patient does not require earwick.  However, I will also prescribe amoxicillin to go induration with current antibiotic eardrops as treatment.  Return precaution given.   03/13/21, PA-C 03/11/21 1445    03/13/21, MD 03/12/21 [phone]

## 2021-03-11 NOTE — ED Triage Notes (Signed)
Pt presents to ED Pov. Pt c/o bilateral ear pain. Pt reports that she was seen here fri for L ear and has been using ear drops but s/s have not improved. Now her R eye hurts as well.

## 2023-04-10 ENCOUNTER — Emergency Department (HOSPITAL_BASED_OUTPATIENT_CLINIC_OR_DEPARTMENT_OTHER)
Admission: EM | Admit: 2023-04-10 | Discharge: 2023-04-10 | Disposition: A | Discharge status: Home / Self Care | Payer: Medicaid Other | Source: Home / Self Care | Attending: Emergency Medicine | Admitting: Emergency Medicine

## 2023-04-10 ENCOUNTER — Encounter (HOSPITAL_BASED_OUTPATIENT_CLINIC_OR_DEPARTMENT_OTHER): Payer: Self-pay

## 2023-04-10 ENCOUNTER — Emergency Department (HOSPITAL_BASED_OUTPATIENT_CLINIC_OR_DEPARTMENT_OTHER): Payer: Medicaid Other

## 2023-04-10 ENCOUNTER — Other Ambulatory Visit: Payer: Self-pay

## 2023-04-10 DIAGNOSIS — Z3A01 Less than 8 weeks gestation of pregnancy: Secondary | ICD-10-CM | POA: Insufficient documentation

## 2023-04-10 DIAGNOSIS — R1084 Generalized abdominal pain: Secondary | ICD-10-CM | POA: Diagnosis not present

## 2023-04-10 DIAGNOSIS — F17210 Nicotine dependence, cigarettes, uncomplicated: Secondary | ICD-10-CM | POA: Diagnosis not present

## 2023-04-10 DIAGNOSIS — O26891 Other specified pregnancy related conditions, first trimester: Secondary | ICD-10-CM | POA: Diagnosis present

## 2023-04-10 DIAGNOSIS — Z3490 Encounter for supervision of normal pregnancy, unspecified, unspecified trimester: Secondary | ICD-10-CM

## 2023-04-10 HISTORY — DX: Calculus of kidney: N20.0

## 2023-04-10 LAB — HEPATIC FUNCTION PANEL
ALT: 11 U/L (ref 0–44)
AST: 13 U/L — ABNORMAL LOW (ref 15–41)
Albumin: 4.1 g/dL (ref 3.5–5.0)
Alkaline Phosphatase: 45 U/L (ref 38–126)
Bilirubin, Direct: 0.1 mg/dL (ref 0.0–0.2)
Indirect Bilirubin: 0.4 mg/dL (ref 0.3–0.9)
Total Bilirubin: 0.5 mg/dL (ref 0.3–1.2)
Total Protein: 6.8 g/dL (ref 6.5–8.1)

## 2023-04-10 LAB — HCG, SERUM, QUALITATIVE: Preg, Serum: POSITIVE — AB

## 2023-04-10 LAB — CBC WITH DIFFERENTIAL/PLATELET
Abs Immature Granulocytes: 0.02 10*3/uL (ref 0.00–0.07)
Basophils Absolute: 0 10*3/uL (ref 0.0–0.1)
Basophils Relative: 0 %
Eosinophils Absolute: 0.2 10*3/uL (ref 0.0–0.5)
Eosinophils Relative: 2 %
HCT: 36.1 % (ref 36.0–46.0)
Hemoglobin: 12.1 g/dL (ref 12.0–15.0)
Immature Granulocytes: 0 %
Lymphocytes Relative: 25 %
Lymphs Abs: 2.2 10*3/uL (ref 0.7–4.0)
MCH: 32.8 pg (ref 26.0–34.0)
MCHC: 33.5 g/dL (ref 30.0–36.0)
MCV: 97.8 fL (ref 80.0–100.0)
Monocytes Absolute: 0.8 10*3/uL (ref 0.1–1.0)
Monocytes Relative: 9 %
Neutro Abs: 5.7 10*3/uL (ref 1.7–7.7)
Neutrophils Relative %: 64 %
Platelets: 332 10*3/uL (ref 150–400)
RBC: 3.69 MIL/uL — ABNORMAL LOW (ref 3.87–5.11)
RDW: 11.9 % (ref 11.5–15.5)
WBC: 8.9 10*3/uL (ref 4.0–10.5)
nRBC: 0 % (ref 0.0–0.2)

## 2023-04-10 LAB — I-STAT CHEM 8, ED
BUN: 6 mg/dL (ref 6–20)
Calcium, Ion: 1.21 mmol/L (ref 1.15–1.40)
Chloride: 104 mmol/L (ref 98–111)
Creatinine, Ser: 0.6 mg/dL (ref 0.44–1.00)
Glucose, Bld: 131 mg/dL — ABNORMAL HIGH (ref 70–99)
HCT: 37 % (ref 36.0–46.0)
Hemoglobin: 12.6 g/dL (ref 12.0–15.0)
Potassium: 3.5 mmol/L (ref 3.5–5.1)
Sodium: 137 mmol/L (ref 135–145)
TCO2: 22 mmol/L (ref 22–32)

## 2023-04-10 LAB — HCG, QUANTITATIVE, PREGNANCY: hCG, Beta Chain, Quant, S: 2804 m[IU]/mL — ABNORMAL HIGH (ref <5)

## 2023-04-10 LAB — LIPASE, BLOOD: Lipase: 21 U/L (ref 11–51)

## 2023-04-10 MED ORDER — ONDANSETRON HCL 4 MG/2ML IJ SOLN
4.0000 mg | Freq: Once | INTRAMUSCULAR | Status: AC
  Administered 2023-04-10: 4 mg via INTRAVENOUS
  Filled 2023-04-10: qty 2

## 2023-04-10 MED ORDER — FENTANYL CITRATE PF 50 MCG/ML IJ SOSY
50.0000 ug | PREFILLED_SYRINGE | Freq: Once | INTRAMUSCULAR | Status: AC | PRN
  Administered 2023-04-10: 50 ug via INTRAVENOUS
  Filled 2023-04-10: qty 1

## 2023-04-10 MED ORDER — FENTANYL CITRATE PF 50 MCG/ML IJ SOSY
50.0000 ug | PREFILLED_SYRINGE | Freq: Once | INTRAMUSCULAR | Status: AC
  Administered 2023-04-10: 50 ug via INTRAVENOUS
  Filled 2023-04-10: qty 1

## 2023-04-10 MED ORDER — ONDANSETRON 4 MG PO TBDP: 4.0000 mg | ORAL_TABLET | Freq: Three times a day (TID) | ORAL | 0 refills | Status: AC | PRN

## 2023-04-10 NOTE — ED Provider Notes (Signed)
[facility] Provider Note: Morgan Dell, MD, FACEP  CSN: [id number] MRN: [id number] ARRIVAL: 04/10/23 at 0154 ROOM: [facility]05/[facility]05   CHIEF COMPLAINT  Abdominal Pain   HISTORY OF PRESENT ILLNESS  04/10/23 2:06 AM Morgan Mccoy is a 42 y.o. female with.:  Epigastric pain since yesterday.  The pain is gradually worsened.  It is hard to characterize but has both crampy and sharp, constant components.  It is worse with movement or palpation.  She did have some nausea prior to arrival but has not vomited or had diarrhea.  She denies fever or urinary symptoms.  She rates it as a 7 out of 10 currently.   Past Medical History:  Diagnosis Date   Kidney stone     Past Surgical History:  Procedure Laterality Date   KIDNEY STONE SURGERY      History reviewed. No pertinent family history.  Social History   Tobacco Use   Smoking status: Every Day    Current packs/day: 0.50    Types: Cigarettes   Smokeless tobacco: Never  Substance Use Topics   Alcohol use: Yes   Drug use: No    Prior to Admission medications   Medication Sig Start Date End Date Taking? Authorizing Provider  ondansetron (ZOFRAN-ODT) 4 MG disintegrating tablet Take 1 tablet (4 mg total) by mouth every 8 (eight) hours as needed for nausea or vomiting. 04/10/23  Yes Tegeler, Canary Brim, MD  amoxicillin (AMOXIL) 500 MG capsule Take 1 capsule (500 mg total) by mouth 3 (three) times daily. 03/11/21   Fayrene Helper, PA-C  ibuprofen (ADVIL) 600 MG tablet Take 1 tablet (600 mg total) by mouth every 6 (six) hours as needed. 03/08/21   Gailen Shelter, PA  metroNIDAZOLE (FLAGYL) 500 MG tablet Take 1 tablet (500 mg total) by mouth 2 (two) times daily. One po bid x 7 days 09/15/17   Pricilla Loveless, MD  ondansetron (ZOFRAN ODT) 8 MG disintegrating tablet Take 1 tablet (8 mg total) by mouth every 8 (eight) hours as needed for nausea or vomiting. 05/05/16   Azalia Bilis, MD    Allergies Morphine and codeine   REVIEW  OF SYSTEMS  Negative except as noted here or in the History of Present Illness.   PHYSICAL EXAMINATION  Initial Vital Signs Blood pressure (!) 150/94, pulse 80, temperature 98 F (36.7 C), resp. rate 20, weight 64.9 kg, SpO2 100%, unknown if currently breastfeeding.  Examination General: Well-developed, well-nourished female in no acute distress; appearance consistent with age of record HENT: normocephalic; atraumatic Eyes: Normal appearance Neck: supple Heart: regular rate and rhythm Lungs: clear to auscultation bilaterally Abdomen: soft; nondistended; epigastric and periumbilical tenderness; no right upper quadrant tenderness; bowel sounds present Extremities: No deformity; full range of motion; pulses normal Neurologic: Awake, alert and oriented; motor function intact in all extremities and symmetric; no facial droop Skin: Warm and dry Psychiatric: Flat affect   RESULTS  Summary of this visit's results, reviewed and interpreted by myself:   EKG Interpretation Date/Time:    Ventricular Rate:    PR Interval:    QRS Duration:    QT Interval:    QTC Calculation:   R Axis:      Text Interpretation:         Laboratory Studies: Results for orders placed or performed during the hospital encounter of 04/10/23 (from the past 24 hour(s))  CBC with Differential     Status: Abnormal   Collection Time: 04/10/23  3:13 AM  Result Value  Ref Range   WBC 8.9 4.0 - 10.5 K/uL   RBC 3.69 (L) 3.87 - 5.11 MIL/uL   Hemoglobin 12.1 12.0 - 15.0 g/dL   HCT 32.4 40.1 - 02.7 %   MCV 97.8 80.0 - 100.0 fL   MCH 32.8 26.0 - 34.0 pg   MCHC 33.5 30.0 - 36.0 g/dL   RDW 25.3 66.4 - 40.3 %   Platelets 332 150 - 400 K/uL   nRBC 0.0 0.0 - 0.2 %   Neutrophils Relative % 64 %   Neutro Abs 5.7 1.7 - 7.7 K/uL   Lymphocytes Relative 25 %   Lymphs Abs 2.2 0.7 - 4.0 K/uL   Monocytes Relative 9 %   Monocytes Absolute 0.8 0.1 - 1.0 K/uL   Eosinophils Relative 2 %   Eosinophils Absolute 0.2 0.0 - 0.5  K/uL   Basophils Relative 0 %   Basophils Absolute 0.0 0.0 - 0.1 K/uL   Immature Granulocytes 0 %   Abs Immature Granulocytes 0.02 0.00 - 0.07 K/uL  Hepatic function panel     Status: Abnormal   Collection Time: 04/10/23  3:13 AM  Result Value Ref Range   Total Protein 6.8 6.5 - 8.1 g/dL   Albumin 4.1 3.5 - 5.0 g/dL   AST 13 (L) 15 - 41 U/L   ALT 11 0 - 44 U/L   Alkaline Phosphatase 45 38 - 126 U/L   Total Bilirubin 0.5 0.3 - 1.2 mg/dL   Bilirubin, Direct 0.1 0.0 - 0.2 mg/dL   Indirect Bilirubin 0.4 0.3 - 0.9 mg/dL  Lipase, blood     Status: None   Collection Time: 04/10/23  3:13 AM  Result Value Ref Range   Lipase 21 11 - 51 U/L  hCG, serum, qualitative     Status: Abnormal   Collection Time: 04/10/23  3:13 AM  Result Value Ref Range   Preg, Serum POSITIVE (A) NEGATIVE  hCG, quantitative, pregnancy     Status: Abnormal   Collection Time: 04/10/23  3:13 AM  Result Value Ref Range   hCG, Beta Chain, Quant, S 2,804 (H) <5 mIU/mL  I-stat chem 8, ED (not at [facility], [facility] or [facility])     Status: Abnormal   Collection Time: 04/10/23  3:24 AM  Result Value Ref Range   Sodium 137 135 - 145 mmol/L   Potassium 3.5 3.5 - 5.1 mmol/L   Chloride 104 98 - 111 mmol/L   BUN 6 6 - 20 mg/dL   Creatinine, Ser 4.74 0.44 - 1.00 mg/dL   Glucose, Bld 259 (H) 70 - 99 mg/dL   Calcium, Ion 5.63 8.75 - 1.40 mmol/L   TCO2 22 22 - 32 mmol/L   Hemoglobin 12.6 12.0 - 15.0 g/dL   HCT 64.3 32.9 - 51.8 %   Imaging Studies: US OB LESS THAN 14 WEEKS WITH OB TRANSVAGINAL  Result Date: 04/10/2023 CLINICAL DATA:  Abdominal pain in 1st trimester pregnancy. EXAM: OBSTETRIC <14 WK [place] AND TRANSVAGINAL OB US TECHNIQUE: Both transabdominal and transvaginal ultrasound examinations were performed for complete evaluation of the gestation as well as the maternal uterus, adnexal regions, and pelvic cul-de-sac. Transvaginal technique was performed to assess early pregnancy. COMPARISON:  None Available. FINDINGS: Intrauterine  gestational sac: Single Yolk sac:  Not Visualized. Embryo:  Not Visualized. MSD: 5 mm   5 w   2 d Subchorionic hemorrhage:  None visualized. Maternal uterus/adnexae: Several tiny uterine fibroids are seen measuring up to 1 cm in diameter. Both ovaries are normal  in appearance, with a small left corpus luteum noted. No suspicious adnexal mass or abnormal free fluid identified. IMPRESSION: Single intrauterine gestational sac measuring 5 weeks 2 days by mean sac diameter. Consider correlation with serial b-hCG levels, and followup ultrasound to assess viability in 10-14 days. Several small uterine fibroids measuring up to 1 cm in diameter. Electronically Signed   By: Danae Orleans M.D.   On: 04/10/2023 09:53    ED COURSE and MDM  Nursing notes, initial and subsequent vitals signs, including pulse oximetry, reviewed and interpreted by myself.  Vitals:   04/10/23 0202 04/10/23 0558 04/10/23 0917 04/10/23 1049  BP: (!) 150/94 (!) 148/90 (!) 139/91 (!) 139/91  Pulse: 80 83 75 75  Resp: 20 19 16 18   Temp: 98 F (36.7 C) 98.1 F (36.7 C) 98.1 F (36.7 C) 98.1 F (36.7 C)  TempSrc:  Oral Oral Oral  SpO2: 100% 99% 100% 100%  Weight: 64.9 kg      Medications  ondansetron (ZOFRAN) injection 4 mg (4 mg Intravenous Given 04/10/23 0321)  fentaNYL (SUBLIMAZE) injection 50 mcg (50 mcg Intravenous Given 04/10/23 0321)  fentaNYL (SUBLIMAZE) injection 50 mcg (50 mcg Intravenous Given 04/10/23 0943)   5:27 AM Patient pregnant.  Will obtain a pelvic ultrasound to evaluate for possible ectopic pregnancy.  6:05 AM Patient advised of her pregnancy status.  Quantitative hCG is 2084.  Patient states her last menstrual period was at the end of last month.  Ultrasound pending.  7:00 AM  Signed out to Dr. Rush Landmark.   PROCEDURES  Procedures   ED DIAGNOSES     ICD-10-CM   1. Early stage of pregnancy  Z34.90     2. Generalized abdominal pain  R10.84          Paula Libra, MD 04/10/23 2237

## 2023-04-10 NOTE — ED Provider Notes (Signed)
7:02 AM Care assumed from Dr. Read Drivers.  At time of transfer of care, patient is awaiting ultrasound to rule out ectopic pregnancy in the setting of mid/upper abdominal pain with nausea and vomiting and a positive pregnancy test.  There is no reported pelvic pain, vaginal bleeding, or vaginal discharge.  Previous team did not feel that pelvic ultrasound was needed at this time.  Labs otherwise reassuring aside from a positive pregnancy test.  Plan of care is to get the ultrasound and reassess.  Per team it will be about 8:30 AM when the ultrasound is possible.  10:44 AM Ultrasound returned showing intrauterine gestational sac at 5 weeks and 2 days by size.  No other complicating factors seen aside from some fibroids that we discussed.  Patient will follow-up with OB/GYN in 10 to 14 days to discuss repeat ultrasound or testing.  Patient agrees.  She did want prescription for Zofran that we discussed using.  She still wants it.  She will rest and stay hydrated and follow-up.  She no other questions or concerns and was discharged in good condition.  Of note, she does not want to wait for urinalysis and would rather follow-up with OB/GYN for this.  She has no urinary symptoms reported.   Clinical Impression: 1. Early stage of pregnancy   2. Generalized abdominal pain     Disposition: Discharge  Condition: Good  I have discussed the results, Dx and Tx plan with the pt(& family if present). He/she/they expressed understanding and agree(s) with the plan. Discharge instructions discussed at great length. Strict return precautions discussed and pt &/or family have verbalized understanding of the instructions. No further questions at time of discharge.    New Prescriptions   ONDANSETRON (ZOFRAN-ODT) 4 MG DISINTEGRATING TABLET    Take 1 tablet (4 mg total) by mouth every 8 (eight) hours as needed for nausea or vomiting.    Follow Up: Center for [facility] at [facility] [address] [place] [address] [phone] Call    your [facility] Emergency Department at [facility] [address] [place] [address] [phone]       Aniket Paye, Canary Brim, MD 04/10/23 1046

## 2023-04-10 NOTE — Discharge Instructions (Signed)
Your history, exam, and evaluation today are consistent with early pregnancy that may have contributed to your symptoms.  The ultrasound showed a intrauterine stational sac at 5 weeks and 2 days so please follow-up with your OB/GYN in the next 10 to 14 days to discuss further testing.  Please rest and stay hydrated.  We discussed maintaining hydration and you did want a prescription for nausea medicine.  Please follow-up with the OB/GYN team if any symptoms change or worsen acutely, please return to the nearest emergency department.

## 2023-04-10 NOTE — ED Triage Notes (Signed)
Pt arrives with c/o ABD pain that started yesterday. Pt endorse n/v. Pt denies diarrhea or fevers.
